# Patient Record
Sex: Male | Born: 1960 | Race: White | Hispanic: No | Marital: Married | Smoking: Never smoker
Health system: Southern US, Community
[De-identification: ages and names within clinical notes are randomized; demographics above are authoritative.]

## PROBLEM LIST (undated history)

## (undated) DIAGNOSIS — E119 Type 2 diabetes mellitus without complications: Secondary | ICD-10-CM

## (undated) DIAGNOSIS — I517 Cardiomegaly: Secondary | ICD-10-CM

## (undated) DIAGNOSIS — I452 Bifascicular block: Secondary | ICD-10-CM

## (undated) DIAGNOSIS — I2510 Atherosclerotic heart disease of native coronary artery without angina pectoris: Secondary | ICD-10-CM

## (undated) DIAGNOSIS — E785 Hyperlipidemia, unspecified: Secondary | ICD-10-CM

## (undated) DIAGNOSIS — I10 Essential (primary) hypertension: Secondary | ICD-10-CM

## (undated) DIAGNOSIS — I639 Cerebral infarction, unspecified: Secondary | ICD-10-CM

## (undated) DIAGNOSIS — Z951 Presence of aortocoronary bypass graft: Secondary | ICD-10-CM

## (undated) HISTORY — DX: Cerebral infarction, unspecified: I63.9

## (undated) HISTORY — DX: Hyperlipidemia, unspecified: E78.5

## (undated) HISTORY — DX: Type 2 diabetes mellitus without complications: E11.9

## (undated) HISTORY — DX: Cardiomegaly: I51.7

## (undated) HISTORY — DX: Essential (primary) hypertension: I10

## (undated) HISTORY — DX: Atherosclerotic heart disease of native coronary artery without angina pectoris: I25.10

## (undated) HISTORY — DX: Bifascicular block: I45.2

## (undated) HISTORY — DX: Presence of aortocoronary bypass graft: Z95.1

---

## 2007-05-27 ENCOUNTER — Encounter (INDEPENDENT_AMBULATORY_CARE_PROVIDER_SITE_OTHER): Payer: Self-pay | Admitting: Neurology

## 2007-05-27 ENCOUNTER — Inpatient Hospital Stay (HOSPITAL_COMMUNITY): Admission: EM | Admit: 2007-05-27 | Discharge: 2007-05-28 | Payer: Self-pay | Admitting: Emergency Medicine

## 2007-06-17 ENCOUNTER — Emergency Department (HOSPITAL_COMMUNITY): Admission: EM | Admit: 2007-06-17 | Discharge: 2007-06-17 | Payer: Self-pay | Admitting: *Deleted

## 2007-06-19 DIAGNOSIS — Z951 Presence of aortocoronary bypass graft: Secondary | ICD-10-CM

## 2007-06-19 HISTORY — DX: Presence of aortocoronary bypass graft: Z95.1

## 2007-06-23 ENCOUNTER — Ambulatory Visit: Payer: Self-pay | Admitting: Thoracic Surgery (Cardiothoracic Vascular Surgery)

## 2007-06-23 ENCOUNTER — Inpatient Hospital Stay (HOSPITAL_COMMUNITY): Admission: AD | Admit: 2007-06-23 | Discharge: 2007-06-29 | Payer: Self-pay | Admitting: Cardiovascular Disease

## 2007-06-23 HISTORY — PX: CARDIAC CATHETERIZATION: SHX172

## 2007-06-24 ENCOUNTER — Encounter: Payer: Self-pay | Admitting: Thoracic Surgery (Cardiothoracic Vascular Surgery)

## 2007-06-24 HISTORY — PX: CORONARY ARTERY BYPASS GRAFT: SHX141

## 2007-07-02 ENCOUNTER — Ambulatory Visit: Payer: Self-pay | Admitting: Thoracic Surgery (Cardiothoracic Vascular Surgery)

## 2007-07-19 ENCOUNTER — Ambulatory Visit: Payer: Self-pay | Admitting: Thoracic Surgery (Cardiothoracic Vascular Surgery)

## 2007-07-19 ENCOUNTER — Encounter
Admission: RE | Admit: 2007-07-19 | Discharge: 2007-07-19 | Payer: Self-pay | Admitting: Thoracic Surgery (Cardiothoracic Vascular Surgery)

## 2007-07-22 ENCOUNTER — Encounter (HOSPITAL_COMMUNITY): Admission: RE | Admit: 2007-07-22 | Discharge: 2007-08-18 | Payer: Self-pay | Admitting: Cardiovascular Disease

## 2007-08-19 ENCOUNTER — Encounter (HOSPITAL_COMMUNITY): Admission: RE | Admit: 2007-08-19 | Discharge: 2007-11-17 | Payer: Self-pay | Admitting: Cardiovascular Disease

## 2007-08-24 DIAGNOSIS — I517 Cardiomegaly: Secondary | ICD-10-CM

## 2007-08-24 HISTORY — DX: Cardiomegaly: I51.7

## 2007-09-08 ENCOUNTER — Encounter: Admission: RE | Admit: 2007-09-08 | Discharge: 2007-10-01 | Payer: Self-pay | Admitting: Neurology

## 2009-01-31 ENCOUNTER — Observation Stay (HOSPITAL_COMMUNITY): Admission: EM | Admit: 2009-01-31 | Discharge: 2009-02-01 | Payer: Self-pay | Admitting: Emergency Medicine

## 2009-05-29 ENCOUNTER — Emergency Department (HOSPITAL_COMMUNITY): Admission: EM | Admit: 2009-05-29 | Discharge: 2009-05-30 | Payer: Self-pay | Admitting: Emergency Medicine

## 2009-05-31 ENCOUNTER — Inpatient Hospital Stay (HOSPITAL_COMMUNITY): Admission: EM | Admit: 2009-05-31 | Discharge: 2009-06-03 | Payer: Self-pay | Admitting: Emergency Medicine

## 2009-05-31 ENCOUNTER — Encounter (INDEPENDENT_AMBULATORY_CARE_PROVIDER_SITE_OTHER): Payer: Self-pay | Admitting: General Surgery

## 2009-05-31 HISTORY — PX: CHOLECYSTECTOMY: SHX55

## 2009-08-08 IMAGING — CR DG CHEST 2V
2 series · 2 of 2 positions shown · non-contrast
Comparison: None.

Exam: Chest, 2 views.

HISTORY: Chest tightness.

[w chest pa]
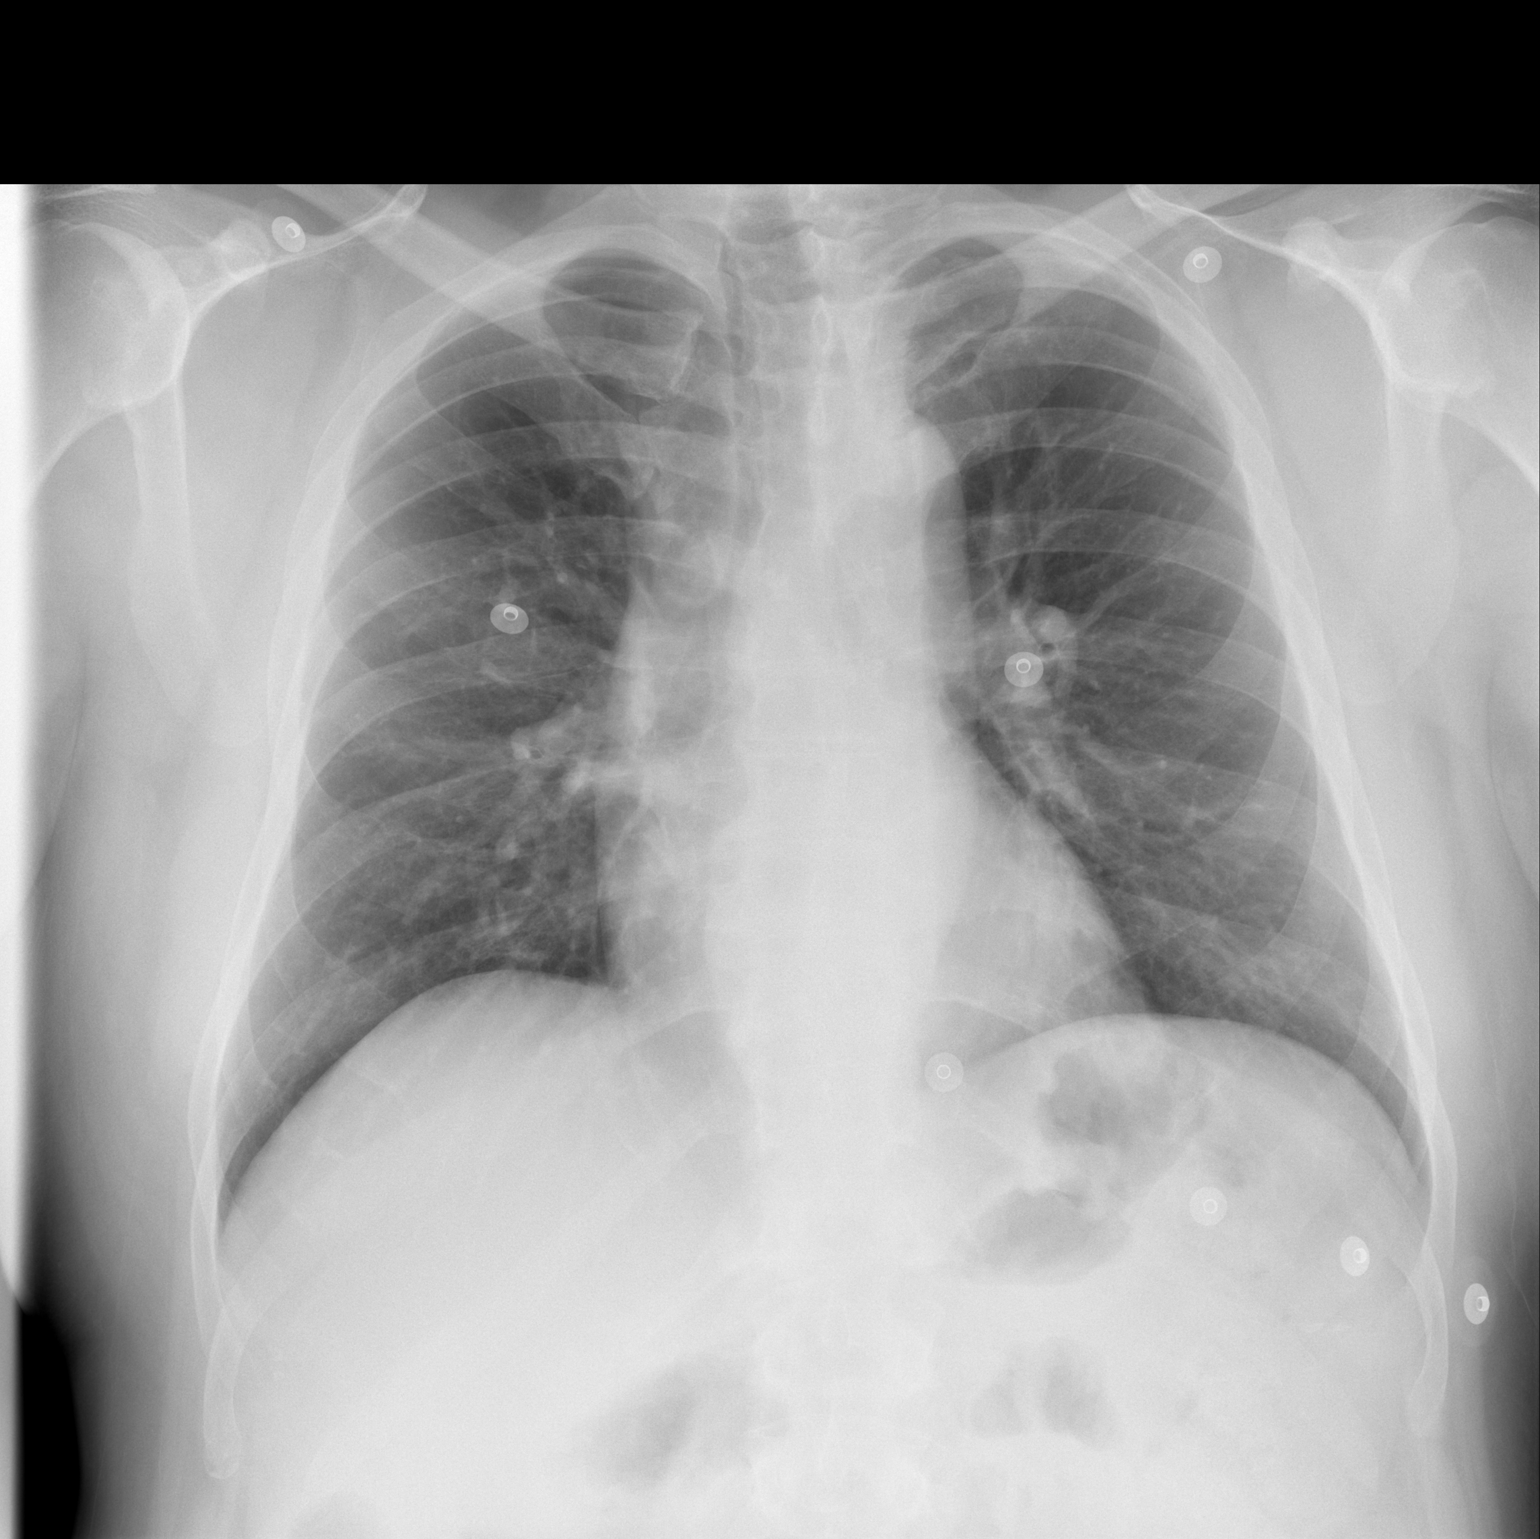

[w chest lat]
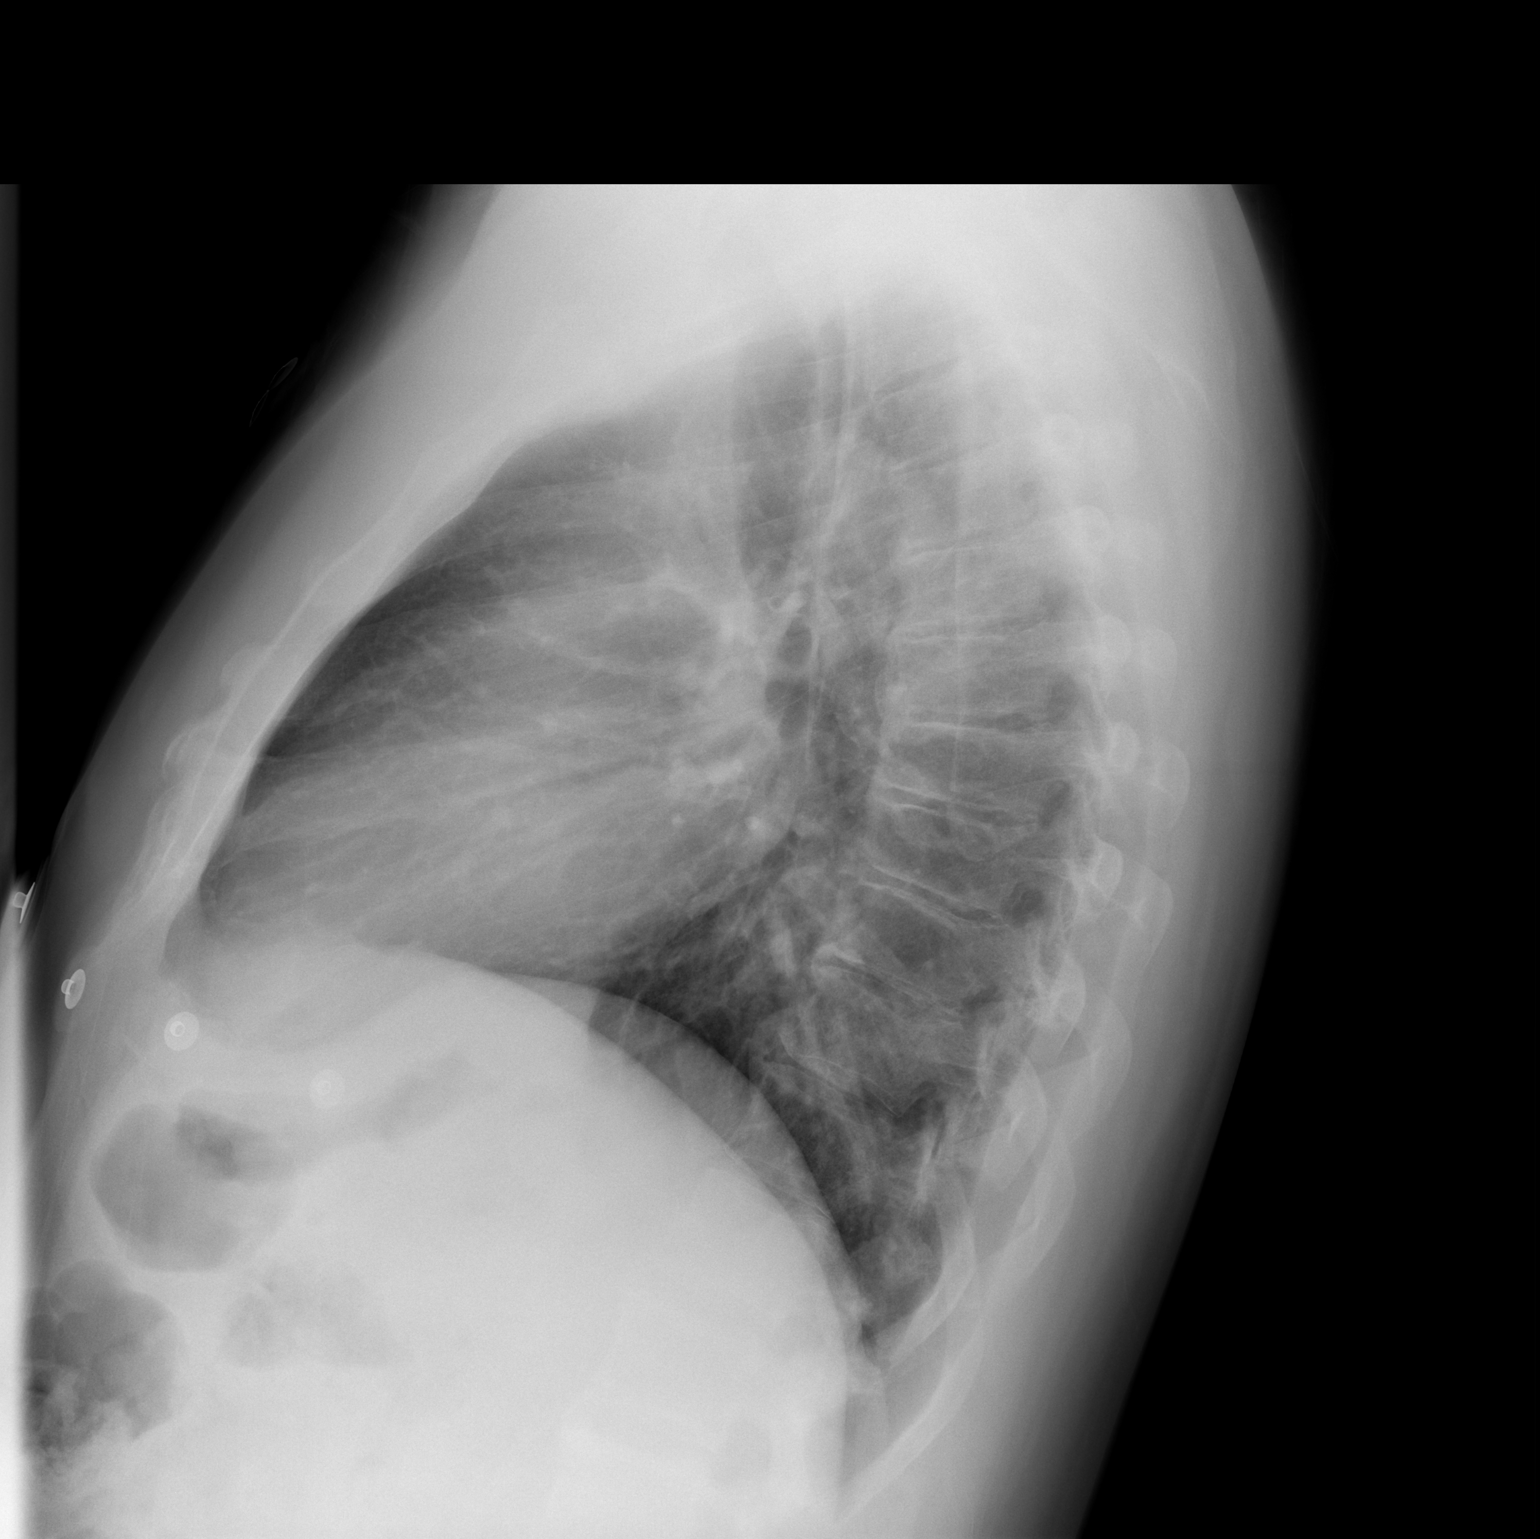

[2 of 2 positions shown; findings below may reference images not displayed]

FINDINGS: Heart size and mediastinal contours are normal.

There is no pleural fluid or pulmonary edema.

No airspace opacities are identified.
IMPRESSION: 1. No active cardiopulmonary disease.

## 2010-11-21 LAB — GLUCOSE, CAPILLARY
Glucose-Capillary: 106 mg/dL — ABNORMAL HIGH (ref 70–99)
Glucose-Capillary: 118 mg/dL — ABNORMAL HIGH (ref 70–99)
Glucose-Capillary: 154 mg/dL — ABNORMAL HIGH (ref 70–99)
Glucose-Capillary: 160 mg/dL — ABNORMAL HIGH (ref 70–99)
Glucose-Capillary: 178 mg/dL — ABNORMAL HIGH (ref 70–99)
Glucose-Capillary: 203 mg/dL — ABNORMAL HIGH (ref 70–99)

## 2010-11-21 LAB — DIFFERENTIAL
Basophils Absolute: 0 10*3/uL (ref 0.0–0.1)
Basophils Relative: 0 % (ref 0–1)
Eosinophils Absolute: 0 10*3/uL (ref 0.0–0.7)
Eosinophils Relative: 0 % (ref 0–5)
Lymphocytes Relative: 8 % — ABNORMAL LOW (ref 12–46)
Monocytes Relative: 3 % (ref 3–12)
Neutro Abs: 10.5 10*3/uL — ABNORMAL HIGH (ref 1.7–7.7)

## 2010-11-21 LAB — CBC
Hemoglobin: 11.4 g/dL — ABNORMAL LOW (ref 13.0–17.0)
Hemoglobin: 11.6 g/dL — ABNORMAL LOW (ref 13.0–17.0)
MCV: 99.8 fL (ref 78.0–100.0)
Platelets: 197 10*3/uL (ref 150–400)
Platelets: 221 10*3/uL (ref 150–400)
Platelets: 233 10*3/uL (ref 150–400)
RBC: 3.24 MIL/uL — ABNORMAL LOW (ref 4.22–5.81)
RBC: 3.37 MIL/uL — ABNORMAL LOW (ref 4.22–5.81)
RBC: 3.76 MIL/uL — ABNORMAL LOW (ref 4.22–5.81)
RDW: 12.6 % (ref 11.5–15.5)
RDW: 12.6 % (ref 11.5–15.5)
RDW: 12.5 % (ref 11.5–15.5)
WBC: 10.1 10*3/uL (ref 4.0–10.5)
WBC: 7.6 10*3/uL (ref 4.0–10.5)
WBC: 11.9 10*3/uL — ABNORMAL HIGH (ref 4.0–10.5)

## 2010-11-21 LAB — POCT CARDIAC MARKERS
CKMB, poc: 1.4 ng/mL (ref 1.0–8.0)
CKMB, poc: 2.1 ng/mL (ref 1.0–8.0)
Myoglobin, poc: 54.3 ng/mL (ref 12–200)
Troponin i, poc: <0.05 ng/mL (ref 0.00–0.09)
Troponin i, poc: <0.05 ng/mL (ref 0.00–0.09)

## 2010-11-21 LAB — COMPREHENSIVE METABOLIC PANEL
BUN: 15 mg/dL (ref 6–23)
Chloride: 98 mEq/L (ref 96–112)
Glucose, Bld: 246 mg/dL — ABNORMAL HIGH (ref 70–99)
Potassium: 4.1 mEq/L (ref 3.5–5.1)
Sodium: 133 mEq/L — ABNORMAL LOW (ref 135–145)
Total Protein: 7.5 g/dL (ref 6.0–8.3)

## 2010-11-25 LAB — LIPID PANEL
Cholesterol: 107 mg/dL (ref 0–200)
LDL Cholesterol: 45 mg/dL (ref 0–99)
Total CHOL/HDL Ratio: 4.5 RATIO
VLDL: 38 mg/dL (ref 0–40)

## 2010-11-25 LAB — CBC
Hemoglobin: 12.8 g/dL — ABNORMAL LOW (ref 13.0–17.0)
MCHC: 35.2 g/dL (ref 30.0–36.0)
RDW: 12.8 % (ref 11.5–15.5)

## 2010-11-25 LAB — CK TOTAL AND CKMB (NOT AT ARMC): Relative Index: 1.9 (ref 0.0–2.5)

## 2010-11-25 LAB — DIFFERENTIAL
Basophils Absolute: 0 10*3/uL (ref 0.0–0.1)
Basophils Relative: 0 % (ref 0–1)
Monocytes Absolute: 0.5 10*3/uL (ref 0.1–1.0)
Neutro Abs: 4.6 10*3/uL (ref 1.7–7.7)
Neutrophils Relative %: 73 % (ref 43–77)

## 2010-11-25 LAB — GLUCOSE, CAPILLARY
Glucose-Capillary: 115 mg/dL — ABNORMAL HIGH (ref 70–99)
Glucose-Capillary: 176 mg/dL — ABNORMAL HIGH (ref 70–99)

## 2010-11-25 LAB — POCT CARDIAC MARKERS
CKMB, poc: 1.3 ng/mL (ref 1.0–8.0)
Myoglobin, poc: 57.6 ng/mL (ref 12–200)
Troponin i, poc: <0.05 ng/mL (ref 0.00–0.09)
Troponin i, poc: <0.05 ng/mL (ref 0.00–0.09)

## 2010-11-25 LAB — APTT: aPTT: 28 seconds (ref 24–37)

## 2010-11-25 LAB — PROTIME-INR
INR: 1 (ref 0.00–1.49)
Prothrombin Time: 13.8 seconds (ref 11.6–15.2)

## 2010-11-25 LAB — BASIC METABOLIC PANEL
CO2: 25 mEq/L (ref 19–32)
Calcium: 8.9 mg/dL (ref 8.4–10.5)
Creatinine, Ser: 0.75 mg/dL (ref 0.4–1.5)
Glucose, Bld: 275 mg/dL — ABNORMAL HIGH (ref 70–99)

## 2010-11-25 LAB — COMPREHENSIVE METABOLIC PANEL
Albumin: 3.6 g/dL (ref 3.5–5.2)
BUN: 12 mg/dL (ref 6–23)
CO2: 27 mEq/L (ref 19–32)
Chloride: 103 mEq/L (ref 96–112)
Creatinine, Ser: 0.71 mg/dL (ref 0.4–1.5)
GFR calc non Af Amer: >60 mL/min (ref >60)
Glucose, Bld: 231 mg/dL — ABNORMAL HIGH (ref 70–99)
Total Bilirubin: 0.6 mg/dL (ref 0.3–1.2)

## 2010-11-25 LAB — HEMOGLOBIN A1C
Hgb A1c MFr Bld: 6.8 % — ABNORMAL HIGH (ref 4.6–6.1)
Mean Plasma Glucose: 148 mg/dL

## 2010-12-31 NOTE — Consult Note (Signed)
[name], [name]               ACCOUNT NO.:  [id number]   MEDICAL RECORD NO.:  [id number]          PATIENT TYPE:  INP   LOCATION:  2807                         FACILITY:  [facility]   PHYSICIAN:  Sheliah Plane, MD    DATE OF BIRTH:  20-Sep-1960   DATE OF CONSULTATION:  DATE OF DISCHARGE:                                 CONSULTATION   This is a cardiac surgery consultation urgently requested by Dr. Clarene Duke  in the catheterization lab.   FOLLOW-UP CARDIOLOGIST:  Nanetta Batty, M.D.   PRIMARY CARE PHYSICIAN:  Dr. Mallie Snooks.   REASON FOR CONSULTATION:  Critical coronary anatomy with ongoing chest  pain.   HISTORY OF PRESENT ILLNESS:  The patient is a 50 year old male known to  be diabetic, recently started on insulin 3 days ago, who had presented  to [facility] approximately 1 month ago with what was described as a  thalamic stroke.  The patient notes that he awoke during the night with  left arm and leg clumsiness and numbness and tingling, was hospitalized  on Dr. Marlis Edelson service.  Doppler studies showed no evidence of carotid  disease.  The patient was ultimately discharged home.  Since being home  he started having left arm numbness and left chest tightness, which he  attributed to the sequelae of his stroke.  Because of these symptoms, he  was seen by Dr. Allyson Sabal.  An EKG was obtained that compared to the EKG  that he had when he was admitted for the stroke, since that time he had  had a new right bundle branch block develop.  A Myoview stress test was  performed yesterday afternoon by Dr. Allyson Sabal, which showed severe anterior  apical ischemia.  Because of the marked abnormality on his stress test  and the discomfort in the left chest and arm, he was admitted last night  and underwent cardiac catheterization.  In the hospital he continues to  have episodic left chest and left arm discomfort.  Because of the  critical anatomy with probable clot in the proximal LAD, urgent  consult  was requested by Dr. Clarene Duke.   He has had no previous history of myocardial infarction or prior cardiac  surgery or prior angioplasty.  He does have a history of hypertension,  hyperlipidemia, type 2 diabetes for 7-8 years started on insulin 3 days  ago.  He is a nonsmoker but does chew tobacco.  Family history is positive.  Father had a myocardial infarction at age  8 and subsequently a stroke.  He has one brother age 11 without known  cardiac disease.  The patient denies claudication, denies renal  insufficiency.  No other medical problems.   PAST SURGICAL HISTORY:  Bilateral carpal tunnel.   SOCIAL HISTORY:  The patient is married, employed as a Chartered certified accountant with  some lifting.  Occasional alcohol use.   MEDICATIONS AT THE TIME OF ADMISSION:  Zocor and aspirin 325 mg a day.  The patient had not been on Plavix.   No known drug allergies.   REVIEW OF SYSTEMS:  CARDIAC:  Positive for chest pain.  He denies  palpitations, lower extremity edema, syncope, presyncope, orthopnea,  exertional or resting shortness of breath.  GENERAL:  Denies  constitutional symptoms such as fever and chills.  Denies shortness of  breath.  No hemoptysis.  No change in bowel habits.  NEUROLOGIC:  Symptoms as noted above.  Denies any joint discomfort.  Denies problems  with urination. No blood in his urine.  Denies easy bruisability.  Denies psychiatric history.   Blood pressure is 156/83, sinus rhythm at 77, O2 saturation is 97%.  Height is 5 feet 11 inches tall, 219 pounds.  The patient is awake, alert and neurologically intact grossly.  He has no carotid bruits.  LUNGS:  Clear bilaterally.  CARDIAC:  No murmur.  He does have multiple tattoos on both upper arms  and his chest.  He has no murmur of mitral insufficiency.  ABDOMEN:  Benign without palpable masses or tenderness.  Lower extremities have full DP and PT pulses bilaterally.   LABORATORY FINDINGS:  A CT scan dated October 9 that shows  acute infarct  in the right posterolateral thalamus.  No other infarcts were  identified.  There is no mass lesion and there is no intracranial  hemorrhage associated with it.  He had mild stenosis of the proximal  right posterior cerebral artery.  Carotid Doppler studies were done and  showed no carotid disease.   Laboratory findings reveal a __________  of 3.2, creatinine is 0.67.  Hematocrit is 38.3, white count is 9.3, platelet count is 273.  Hemoglobin A1c is 8.7.   Cardiac catheterization films are reviewed with Dr. Clarene Duke, which show a  high-grade complex proximal LAD diagonal lesion.  In addition, he has a  bifurcating intermediate vessel with disease at the bifurcation.  The  right coronary artery is a large vessel without significant disease.  Overall LV function appears preserved.  It appears that there is  probably clot in the proximal LAD.  Because of the complexity of the  lesion, the patient's ongoing intermittent pain, positive Myoview  Cardiolite stress with ischemia from the base to the apex, proceeding  with urgent coronary artery bypass grafting is recommended.  The  critical lesions do not appear very amenable to angioplasty, especially  in light of the patient's diabetes.  I discussed with the patient the  risks and options of surgery and also consulted with Dr. Caprice Kluver, and  we will make arrangements to proceed with bypass surgery later today.  The patient is agreeable and Dr. Cornelius Moras is available for surgery.      Sheliah Plane, MD  Electronically Signed     EG/MEDQ  D:  06/24/2007  T:  06/24/2007  Job:  [id number]   cc:   Mallie Snooks, MD  Thereasa Solo. Little, M.D.

## 2010-12-31 NOTE — Discharge Summary (Signed)
[name], [name]               ACCOUNT NO.:  [id number]   MEDICAL RECORD NO.:  [id number]          PATIENT TYPE:  INP   LOCATION:  2018                         FACILITY:  [facility]   PHYSICIAN:  Salvatore Decent. Cornelius Moras, M.D. DATE OF BIRTH:  [birth year]   DATE OF ADMISSION:  06/23/2007  DATE OF DISCHARGE:                               DISCHARGE SUMMARY   FINAL DIAGNOSIS:  Severe two-vessel coronary disease with class IV  unstable angina.   IN-HOSPITAL DIAGNOSES:  1. Postoperative volume overload.  2. Acute blood loss anemia postoperatively.   SECONDARY DIAGNOSES:  1. Diabetes mellitus, type 2.  2. Hypertension.  3. Hyperlipidemia.   IN-HOSPITAL OPERATIONS AND PROCEDURES:  1. Cardiac catheterization.  2. Urgent coronary bypass grafting x3 using a left internal mammary      artery to the distal left anterior descending coronary artery,      saphenous vein graft to first diagonal branch, saphenous vein graft      to lateral subbranch of the circumflex marginal branch, endoscopic      saphenous vein harvest from the left leg.   HISTORY AND PHYSICAL AND HOSPITAL COURSE:  The patient is a 50 year old  male with no previous history of coronary artery disease, who was  recently discovered to have type 2 diabetes mellitus.  The patient was  hospitalized approximately 1 month previously with what was felt to be a  stroke, likely related to hypertension.  Since then, the patient has  escalated in symptoms of numbness in his left arm associated with  tingling in his left chest.  The patient initially attributed his  symptoms to his previous stroke.  Ultimately, he was referred to Dr.  Allyson Sabal and EKG was notably different from that which he had done at that  time of his recent hospitalization.  A stress Myoview exam was done,  also demonstrating severe ischemia involving the anterior wall and apex  of the left ventricle.  He was admitted to the hospital and underwent  cardiac catheterization by  Dr. Clarene Duke.  At this, he was found to have  severe two-vessel coronary artery disease with critical anatomy  involving the proximal left anterior descending coronary artery.  The  patient was felt not to be a candidate for percutaneous coronary  intervention.  Following catheterization, Dr. Cornelius Moras was consulted.  Dr.  Cornelius Moras saw and evaluated the patient.  He discussed with the patient  undergoing urgent coronary artery bypass grafting.  He discussed risks  and benefits with the patient.  The patient acknowledged understanding  and agreed to proceed.  Surgery was scheduled for the same day of  catheterization, June 24, 2007.  For details of the patient's past  medical history and physical exam, please see dictated H&P.   The patient was taken to the operating room on June 24, 2007  urgently, where he underwent coronary artery bypass grafting x3 using a  left internal mammary artery to the distal left anterior descending  coronary artery, saphenous vein graft to first diagonal branch,  saphenous vein graft to a lateral subbranch and circumflex marginal  branch, endoscopic saphenous vein  harvest from left thigh.  The patient  tolerated this procedure well and was transferred to the intensive care  unit in stable condition.  Postoperatively, the patient was noted to be  hemodynamically stable.  He was extubated the evening of surgery.  Post  extubation, the patient was noted to be alert and oriented x4,  neurologically intact.  Postop day #1, the patient's vital signs were  monitored.  They were noted to be stable and he was weaned from all  drips.  Swan-Ganz catheter was discontinued in normal fashion.  Postoperative chest x-ray was clear and he had minimal drainage from  chest tubes.  Chest tubes were discontinued in normal fashion.  The  patient was noted to be in normal sinus rhythm.  The patient had slight  acute blood loss anemia with hemoglobin and hematocrit of 10 and 28%;  the  patient was asymptomatic.  The patient had mild volume overload and  was started on diuretics.  He is also continued on Lantus insulin for  his diabetes mellitus postoperatively.  The patient was felt to be  stable and transferred out to PCTU postop day #1.  While on telemetry  floor, the patient's vital signs were continued to be monitored.  They  did remain stable.  He was able to be weaned off oxygen, saturating  greater than 90% on room air.  The patient's weight was monitored daily.  His volume overload continued to improve and he remained on diuretics.  He was back near baseline prior to discharge home.  The patient's blood  sugars were followed closely postoperatively.  He was restarted on his  Actos and metformin by postop day #3.  He was also continued on low-dose  Lantus insulin at night.  Blood sugars currently remain elevated, but  will continue to not be monitored prior to discharge.  The patient  remained in normal sinus rhythm postoperatively.  His pulmonary status  remained stable.  All incisions are clean, dry and intact and healing  well.  His peripheral edema was improving.  He did have a hematoma at  his right knee incision site; this was monitored during his  hospitalization and remained stable.  The patient was out of bed and  ambulating well without difficulty.  He was tolerating diet well with no  nausea or vomiting noted.   The patient is tentatively ready for discharge home over the next 48  hours pending he remains stable.   FOLLOWUP APPOINTMENTS:  A followup appointment will be arranged with Dr.  Cornelius Moras for 3 weeks; our office will contact the patient at this  information.  The patient will need to obtain PA and lateral chest x-ray  30 minutes prior to this appointment.  The patient will need followup  with Dr. Allyson Sabal in 2 weeks; he will need to contact Dr. Hazle Coca office to  make these arrangements.   ACTIVITY:  The patient is instructed in no driving until  released to do  so, no lifting over 10 pounds.  He is told to ambulate 3-4 times per day  and progress as tolerated and continue his breathing exercises.   INCISIONAL CARE:  The patient is told to shower, washing his incisions  using soap and water.  He is to contact the office if he develops any  drainage or opening from any of his incision sites.   DIET:  The patient was educated on diet to be low-fat, low-salt as well  as carbohydrate-modified medium-calorie diet.  DISCHARGE MEDICATIONS:  1. Aspirin 325 mg daily.  2. Lopressor 50 mg b.i.d..  3. Simvastatin 20 mg at night.  4. Diovan 160 mg daily.  5. Metformin 500 mg b.i.d..  6. Actos 15 mg daily.  7. Lasix 40 mg daily for 5 days.  8. Potassium chloride 20 mEq daily for 5 days.  9. Oxycodone 5 mg one to two tablets q.4-6 h. p.r.n. pain.      Theda Belfast, PA      Salvatore Decent. Cornelius Moras, M.D.  Electronically Signed    KMD/MEDQ  D:  06/27/2007  T:  06/28/2007  Job:  [id number]   cc:   Nanetta Batty, M.D.

## 2010-12-31 NOTE — Op Note (Signed)
[name], [name]               ACCOUNT NO.:  [id number]   MEDICAL RECORD NO.:  [id number]          PATIENT TYPE:  INP   LOCATION:  2316                         FACILITY:  [facility]   PHYSICIAN:  Salvatore Decent. Cornelius Moras, M.D. DATE OF BIRTH:  [birth year]   DATE OF PROCEDURE:  06/24/2007  DATE OF DISCHARGE:                               OPERATIVE REPORT   PREOPERATIVE DIAGNOSIS:  Severe two-vessel coronary artery disease with  class 4 unstable angina   POSTOPERATIVE DIAGNOSIS:  Severe two-vessel coronary artery disease with  class 4 unstable angina   PROCEDURE:  Urgent median sternotomy for coronary artery bypass grafting  x3 (left internal mammary artery to distal left anterior descending  coronary artery, saphenous vein graft to first diagonal branch,  saphenous vein graft to the lateral subbranch of the circumflex marginal  branch, endoscopic saphenous vein harvest from left thigh).   SURGEON:  Dr. Purcell Nails.   ASSISTANT:  Ms. Zadie Rhine.   ANESTHESIA:  General.   CLINICAL NOTE:  The patient is a 50 year old male with no previous  history of coronary artery disease who was recently discovered to have  type 2 diabetes mellitus.  The patient was hospitalized approximately 1  month previously with what was felt to be a stroke likely related to  hypertension.  Since then the patient has had escalating symptoms of  numbness in his left arm associated with tightness in his left chest.  The patient initially attributed the symptoms to his previous stroke.  Ultimately he was referred to Dr. Nanetta Batty and electrocardiogram  was notably different from that which he had done at the time of his  recent hospitalization.  A stress Myoview exam was done also  demonstrating severe ischemia involving the anterior wall and the apex  of the left ventricle.  He was admitted to the hospital that day and  underwent cardiac catheterization earlier this morning by Dr. Caprice Kluver.  The  patient was found to have severe two-vessel coronary artery disease  with critical anatomy involving the proximal left anterior descending  coronary artery felt not to be a candidate for percutaneous coronary  intervention.  Urgent surgical consultation was requested and the  patient was initially seen and evaluated by Dr. Sheliah Plane.  A full  consultation note has been dictated previously.  Urgent surgical  revascularization has been recommended.   OPERATIVE CONSENT:  The patient has been counseled regarding the  indications, risks, and potential benefits of surgery.  Alternative  treatment strategies have been discussed.  The patient understands and  accepts all potential associated risks of surgery and desires to proceed  as described.   OPERATIVE FINDINGS:  1. Normal left ventricular systolic function with mild-to-moderate      left ventricular hypertrophy.  2. Good-quality left internal mammary artery and saphenous vein      conduit for grafting  3. Diffuse coronary artery disease with fair-quality target vessels      for grafting.   DESCRIPTION OF PROCEDURE:  The patient is brought directly from the cath  lab to the operating room on the afternoon of  June 24, 2007 and  placed in the supine position on the operating table.  General  endotracheal anesthesia is introduced uneventfully under the care and  direction of Dr. Judie Petit.  Specifically, a Swan-Ganz catheter  is placed through the right internal jugular approach.  A radial  arterial line is placed.  Intravenous antibiotics were administered.  Following induction with general endotracheal anesthesia, a Foley  catheter is placed.  The patient's chest, abdomen, both groins, and both  lower extremities were prepared and draped in sterile manner.  Baseline  transesophageal echocardiogram is performed by Dr. Randa Evens.  This  demonstrates normal left ventricular systolic function with mild to  moderate left  ventricular hypertrophy.   A median sternotomy incision is performed and the left internal mammary  artery is dissected from the chest wall and prepared for bypass  grafting.  The left internal mammary artery is a good-quality conduit.  Simultaneously the saphenous vein is obtained from the patient's left  thigh using endoscopic vein harvest technique.  Initially, a small  incision is made in the right thigh but the saphenous vein on that side  is felt to be small.  The saphenous vein is then removed from the left  thigh using endoscopic vein harvest technique through a small incision  just above the left knee.  The saphenous vein is slightly small-caliber  but otherwise a good-quality conduit.  After the saphenous vein is  removed, the small incisions in both thighs are closed in multiple  layers with running absorbable suture.  The patient is heparinized  systemically and the left internal mammary artery transected distally.  The left internal mammary artery has excellent flow.   The pericardium is opened.  The ascending aorta is normal in appearance.  The ascending aorta and the right atrium are cannulated for  cardiopulmonary bypass.  Adequate heparinization is verified.  A  retrograde cardioplegic catheter was placed through the right atrium  into the coronary sinus.  Cardiopulmonary bypass was begun and the  surface of the heart inspected.  Distal target vessels are selected for  coronary bypass grafting.  A temperature probe is placed in the left  ventricular septum and a cardioplegic catheter was placed in the  ascending aorta.   The patient is allowed to cool passively to 32 degrees systemic  temperature.  The aortic crossclamp is applied and cold blood  cardioplegia is administered initially in an antegrade fashion through  the aortic root.  Iced saline slush is applied for topical hypothermia.  The initial cardioplegic arrest and myocardial cooling is felt to be  excellent.   Supplemental cardioplegia is administered retrograde through  the coronary sinus catheter.  Repeat doses of cardioplegia are  administered intermittently throughout the crossclamp portion of the  operation through the aortic root, down the subsequently placed vein  grafts, and retrograde through the coronary sinus catheter to maintain  left ventricular septal temperature below 15 degrees centigrade.   The following distal coronary anastomoses are performed:  1. The lateral subbranch of the circumflex marginal branch is grafted      with a saphenous vein graft in an end-to-side fashion. This vessel      measures 2.0 mm in diameter.  It is intramyocardial but a good-      quality target vessel at the site of distal grafting.  2. The first diagonal branch off the left anterior descending coronary      artery is grafted with a saphenous vein graft in an end-to-side  fashion.  This vessel measures 1.0 mm in diameter and is a fair-      quality target vessel for grafting.  3. The distal left anterior descending coronary artery is grafted with      the left internal mammary artery in an end-to-side fashion.  This      vessel is diffusely diseased.  At the site of distal grafting it      measures 1.5 mm in diameter, although this is far out towards the      apex.  A 1.5-mm probe will pass in both directions easily.   Both proximal saphenous vein anastomoses are performed directly to the  ascending aorta prior to removal of the aortic crossclamp.  The left  ventricular septal temperature rises rapidly with reperfusion of the  left internal mammary artery.  One final dose of warm retrograde hot  shot cardioplegia is administered.  The aortic crossclamp is removed  after a total crossclamp time of 63 minutes.  The heart began to beat  spontaneously and normal sinus rhythm resumes.  All proximal and distal  coronary anastomoses are inspected for hemostasis and appropriate graft  orientation.  The retrograde cardioplegic catheter was removed.  Epicardial pacing wires fixed to the right ventricular free wall into  the right atrial appendage.  The patient is rewarmed to 37 degrees  centigrade temperature.  The patient is weaned from cardiopulmonary  bypass without difficulty.  The patient's rhythm at separation from  bypass is normal sinus rhythm.  No inotropic support is required.  Total  cardiopulmonary bypass time for the operation is 76 minutes.  Follow-up  transesophageal echocardiogram performed following separation from  bypass demonstrates normal left ventricular function.   The venous and arterial cannulae are removed uneventfully.  Protamine is  administered to reverse the anticoagulation.  The mediastinum and the  left chest are irrigated with saline solution containing vancomycin.  Meticulous surgical hemostasis is ascertained.  The mediastinum and the  left chest are drained using three chest tubes exited through separate  stab incisions inferiorly.  The pericardium and soft tissues anterior to  the aorta are reapproximated loosely.  The sternum was closed with  double-strength sternal wire.  The soft tissues anterior to the sternum  are closed in multiple layers and the skin is closed with running  subcuticular skin closure.   The patient tolerated the procedure well and was transported to the  surgical intensive care unit in stable condition.  There are no  intraoperative complications.  All sponge, instrument and needle counts  were verified correct at the completion of the operation.  No blood  products were administered.      Salvatore Decent. Cornelius Moras, M.D.  Electronically Signed     CHO/MEDQ  D:  06/24/2007  T:  06/25/2007  Job:  [id number]   cc:   Nanetta Batty, M.D.  Thereasa Solo. Little, M.D.  Mallie Snooks, MD

## 2010-12-31 NOTE — Cardiovascular Report (Signed)
NAMEJERE, BOSTROM               ACCOUNT NO.:  [id number]   MEDICAL RECORD NO.:  [id number]          PATIENT TYPE:  INP   LOCATION:  2399                         FACILITY:  [facility]   PHYSICIAN:  Thereasa Solo. Little, M.D. DATE OF BIRTH:  [birth year]   DATE OF PROCEDURE:  06/23/2007  DATE OF DISCHARGE:                            CARDIAC CATHETERIZATION   INDICATIONS FOR TEST:  This 50 year old hypertensive diabetic male has  not had treatment for two problems for approximately 4 years.  He  presented on May 27, 2007 with a left body stroke that appeared to be  thalamic small vessel disease that involved his thalamus.  He was seen  on June 17, 2007 in the emergency room, and at that time an EKG  showed a new right bundle left anterior fascicular block.  The patient  has complained of chest and arm tightness that he related to residual  from the stroke.  Because of the abnormality, Dr. Aida Puffer sent him  to see Dr. Allyson Sabal who performed a Cardiolite study that was strongly  positive, and the patient was admitted on May 23, 2007 for IV heparin  and urgent cath which was performed this morning.   I spoke to the patient prior to the cath.  He was still having the  residual numbness from his stroke and the residual tightness that he  felt was related to the stroke.   After obtaining informed consent, the patient was prepped and draped in  the usual sterile fashion, exposing the right groin.  Under local  anesthetic with 1% Xylocaine, the Seldinger technique was employed, and  a 5-French introducer sheath was placed in the right femoral artery.  Left and right coronary arteriography, ventriculography in the RAO  projection was performed.   COMPLICATIONS:  None.   EQUIPMENT:  5-French Judkins configuration catheters.   TOTAL CONTRAST USED:  115 mL.   RESULTS:   HEMODYNAMIC MONITORING:  Central aortic pressure was 138/86.  Left  ventricular pressure was 147/19.  At the time of  pullback across the  aortic valve, there was no aortic valve gradient present.   VENTRICULOGRAPHY:  Ventriculography in the RAO projection done at the  end of the procedure revealed normal LV systolic function with an  ejection fraction in excess of 55%.  The apex appeared to be mildly  hypokinetic, but there was no evidence of a filling defect in the left  ventricle to suggest an apical thrombus.  The left ventricular end-  diastolic pressure was 24.  No mitral regurgitation was appreciated.   CORONARY ARTERIOGRAPHY:  1. Left main.  It bifurcated.  2. Circumflex.  The circumflex was a small nondominant vessel that      stayed in the AV groove.  3. LAD.  The LAD was a large vessel that extended down to the apex of      the heart. It only had TIMI I flow.  The vessel appeared to be      subtotaled just after the takeoff of the first diagonal.  There was      thrombus present just after the  first diagonal, and the area of      high-grade stenosis appeared to be about 20 mm in length.  There      was late slow filling of the distal vessel.  The distal vessel was      clearly underfilled and appeared to be relatively small in      diameter, but I suspect that is more related to being underfilled.      Intracoronary nitroglycerin did not change the TIMI flow, and there      is a questionable lesion in the midportion of the vessel of around      40-50%.  4. Optional diagonal.  This was a large vessel that bifurcated.  At      the bifurcation in the upper limb of the bifurcation was a focal      area of 70-80% narrowing.  The lower limb was free of disease.      Attempts at stenting the area of focal narrowing would result in      the stent extending well into the main body of the optional      diagonal.  5. Right coronary artery.  The right coronary was a very large      dominant vessel.  It gave rise to a PDA and 4 posterolateral      vessels, all of which were free of disease.  There  were collaterals      from the right coronary artery filling the distal portion of the      LAD.   Because of the high-grade stenosis in the optional diagonal system and  the subtotaled anatomy in the LAD, particularly in view of it extending  all the way up to the large first diagonal vessel, decision was made  after talking with Dr. Jenne Campus to refer the patient for urgent bypass  surgery.           ______________________________  Thereasa Solo Little, M.D.     ABL/MEDQ  D:  06/24/2007  T:  06/24/2007  Job:  [id number]   cc:   Nanetta Batty, M.D.  Aida Puffer  [facility]

## 2010-12-31 NOTE — Assessment & Plan Note (Signed)
OFFICE VISIT   KIKO, RIPP R  DOB:  [birth year]                                        July 19, 2007  CHART #:  [id number]   HISTORY OF PRESENT ILLNESS:  Mr. Proch returns for routine followup  status post coronary artery bypass grafting x3 on June 24, 2007.  His  postoperative recovery has been uncomplicated.  Following hospital  discharge, he has continued to gradually improve.  He was evaluated in  the office by Dr. Gery Pray one week ago, and he has also been seen in  followup by his primary care physician, Dr. Clarene Duke.  He has had some  mild swelling in his legs that has improved.  He also had some  hypoglycemia.  He was instructed to stop taking Actos, and his dose of  hydrochlorothiazide has been cut back to half dose.  He has also been  started on Lexapro for some situational depression.  Overall, Mr. Westervelt  reports that he is otherwise doing fine. He has minimal residual  soreness in his chest.  He has no exertional shortness of breath and in  fact he states that his exercise tolerance is much better than it was  prior to surgery.  He does still complain of numbness involving his left  forearm and hand related to his previous stroke.  This has been slow to  improve, if it has improved at all.  He is proud to report that his  blood sugars have been under fairly good control.  He has no other  complaints.   The remainder of his medications remain unchanged from the time of  hospital discharge.   PHYSICAL EXAMINATION:  Notable for a well-appearing male with a blood  pressure of 124/70, pulse 70, oxygen saturation 98% on room air.  Examination of the chest reveals a median sternotomy incision that is  healing nicely.  The sternum is stable on palpation.  Breath sounds are  clear to auscultation and symmetrical bilaterally.  No wheezes or  rhonchi are noted.  Cardiovascular exam includes regular rate and  rhythm.  No murmurs, rubs, or gallops are  appreciated.  The abdomen is  soft and nontender.  Bowel sounds are present.  The extremities are warm  and well perfused.  The right lower extremity endoscopic and harvest  incisions have healed nicely.  There is trivial lower extremity edema.   Diagnostic tests:  Chest x-ray obtained today is reviewed.  This  demonstrates clear lung fields bilaterally with very small left pleural  effusion and associated mild left lower lobe atelectasis.   IMPRESSION:  Satisfactory progress following recent coronary artery  bypass grafting.   PLAN:  I have encouraged Mr. Mcgregor to continue to gradually increase his  physical activity as tolerated.  His only limitation, at this point,  remaining that he refrain from heavy lifting or strenuous use of his  arms or shoulders for at least another 2 months.  With respect to his  persistent numbness and weakness involving his left forearm and hand  related to his recent stroke, he may need some physical therapy and/or  occupational therapy.  He may ultimately qualify for long-term  disability depending upon return of function.  We will this to Dr.  Marlis Edelson discretion under the circumstances with his recent stroke.  We  have discussed his long-term  prognosis with respect to his coronary  artery disease, as well as the intendant need for him to continue to  maintain very strict control of his diabetes indefinitely in the future.  All of his questions have been addressed.  In the future, Mr. Tedesco will  call and return to see [place] as needed.   Salvatore Decent. Cornelius Moras, M.D.  Electronically Signed   CHO/MEDQ  D:  07/19/2007  T:  07/20/2007  Job:  [id number]   cc:   Nanetta Batty, M.D.  Pramod P. Pearlean Brownie, MD

## 2010-12-31 NOTE — H&P (Signed)
[name], [name]               ACCOUNT NO.:  [id number]   MEDICAL RECORD NO.:  [id number]          PATIENT TYPE:  EMS   LOCATION:  MAJO                         FACILITY:  [facility]   PHYSICIAN:  Genene Churn. Love, M.D.    DATE OF BIRTH:  Mar 26, 1961   DATE OF ADMISSION:  05/27/2007  DATE OF DISCHARGE:                              HISTORY & PHYSICAL   This is the first [name] admission for this 50 year old  right-handed white married male from [place], [place], seen in  the emergency room and admitted for evaluation of left-sided weakness  and numbness.   HISTORY OF PRESENT ILLNESS:  [name] is formerly from [place].  He  moved to [place] approximately 5 years ago.  While in [place]  approximately 10-15 years ago, he was told that he had hypertension and  about 8 years ago was told that he had diabetes mellitus.  Neither one  of these diseases are currently being treated with any medications.  He  has done seen a physician since living in [place].  He went to bed on  the night of admission feeling well about midnight and awoke about 2  a.m. with cramping on his left side, noticed numbness on his left side  and fell when he tried to get out of bed.  He had no associated  headache, syncope, chest pain, palpitations or seizure with this event.  Symptomatology improved en route to the emergency room.  He states that  2 days prior to admission he had some difficulty with chest pain.   He has had a known past history of hypertension for 15 years not on  treatment, diabetes mellitus for 8 years, not on treatment, has had  carpal tunnel syndrome to the right and left hand in approximately 1989.   He is currently on no medications and has no known history of drug  allergies except to ONE ANTIBIOTIC, and he cannot remember that name.   He does not smoke cigarettes but he does chew tobacco.  He drinks  approximately six beers per week.   SOCIAL HISTORY:  He  finished high school.  Works as Psychologist, counselling.  He has no children.  He is married.   FAMILY HISTORY:  His mother is in her 76s.  Father is 66, living and  well.  He has a brother 28, living and well.   MEDICAL REVIEW OF SYSTEMS:  He does snore at night.  There is no  definite history of sleep apnea.  He does have diabetes and hypertension  as mentioned, which are currently not being treated.   Examination revealed a well-developed, obese white male.  Blood pressure in the right arm 170/85, left arm 160/85 (when he arrived  in the emergency room, his systolics were greater than 220 and he  received IV labetalol).  Heart rate was 83 and regular.  There were no  bruits.  Mental status:  He was alert and oriented x3.  Followed one, two and  three-step commands.  Cranial nerve examination with visual fields full.  He had  blurred margins to the nasal portion of the disk bilaterally.  The extraocular movements were full.  Corneals were present.  He had a  seventh nerve palsy.  Tongue was midline.  Uvula was midline.  Gags were  present.  Sternocleidomastoid and trapezius testing was normal.  Motor  examination revealed 5/5 strength in the upper and lower extremities.  He had mild left arm drift.  Sensory examination was intact to pinprick,  touch, joint position and vibration testing.  Deep tendon reflexes were  1-2+ and plantar responses were downgoing.  General examination revealed the tympanic membranes to be clear.  Mouth  was in good repair.  There was no heart murmur.  Lungs were clear.  There was no enlargement of the liver, spleen or kidneys.  His bowel  sounds were normal.  His genitalia were normal.   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  I  did not see any definite evidence of stroke.  It was reported that he  had had a right basal ganglia stroke.  I noticed that there was some  obscuration of the sulci over the  convexities, raising the question of  cerebral edema.  There was not, however, any changes in the ventricular  size and the sulci below the parietal region were well-seen.  EKG showed  left axis deviation.  PTT was 29, INR 1.1.  Drug screen negative.  Sodium 138, potassium 3.3, chloride 102, CO2 content 29, BUN 8,  creatinine 0.65, glucose 279.  White blood cell count was 11,000,  hemoglobin 14.5, hematocrit 41.6, platelets 273,000.  Urine drug screen  was negative.   IMPRESSION:  1. Right brain transient ischemic attack versus stroke, code 435.9 and      434.1, with NIH Stroke Scale of 1.  2. Hypertension, code 796.2.  3. Diabetes mellitus, code 250.60.   Plan at this time is to admit the patient for further evaluation.  He  will be placed on aspirin therapy and Lovenox.  I will start  antihypertensives.           ______________________________  Genene Churn. Sandria Manly, M.D.     JML/MEDQ  D:  05/27/2007  T:  05/27/2007  Job:  [id number]

## 2010-12-31 NOTE — Discharge Summary (Signed)
[name], [name]               ACCOUNT NO.:  [id number]   MEDICAL RECORD NO.:  [id number]          PATIENT TYPE:  INP   LOCATION:  3022                         FACILITY:  [facility]   PHYSICIAN:  Pramod P. Pearlean Brownie, MD    DATE OF BIRTH:  12/25/1960   DATE OF ADMISSION:  05/27/2007  DATE OF DISCHARGE:  05/28/2007                               DISCHARGE SUMMARY   DISCHARGE DIAGNOSES:  1. 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  2. Hypertension.  3. Diabetes.  4. Obesity.  5. Dyslipidemia.   DISCHARGE MEDICATIONS:  1. Diovan 80 mg a day.  2. Aspirin 325 mg a day.  3. Zocor 20 mg a day.  4. Metformin 500 mg b.i.d.   STUDIES PERFORMED:  1. CT of the brain on admission showed no acute abnormalities.      Subacute to chronic right basal ganglia lacune.  2. MRI of the brain showed  an acute right thalamic infarct.  An MRA      of the brain showed mild stenosis of the proximal right PCA.  3. 2-D echocardiogram showed EF of 50% to 60% with no left ventricular      regional wall motion abnormalities.  No embolic source.  4. Carotid Doppler showed no ICA stenosis.  5. Transcranial Doppler was performed, results pending.  6. EKG showed normal sinus rhythm.   LABORATORY STUDIES:  CBC with white blood cells 11, otherwise normal.  Chemistry with potassium 3.3, glucose 279, otherwise normal.  Coagulation studies on admission normal.  Liver function tests normal.  Cardiac enzymes negative.  Cholesterol 157, triglycerides 123, HDL 25  and LDL 107.  Urinalysis 0-2 white blood cells, 0-2 red blood cells,  otherwise negative.  Homocystine is pending.  Alcohol level less than 5.  Hemoglobin A1c 9.  Urine drug screen negative.   HISTORY OF PRESENT ILLNESS:  Mr. [name] is a 50 year old right-  handed Caucasian male who is formally from [place].  He moved to  [place] approximately 5 years ago.  While in [place], he was told he  had hypertension about 10-15 years ago,  and then about 8 years ago was  told that he had diabetes.  He was treated with medications there, but  has not been currently treated with any medications, as he has not seen  a physician since moving to [place].  He went to bed the night of  admission feeling well about midnight, and woke at 2 a.m. with cramping  on the left side.  At that time he noticed numbness or his left side and  fell when he tried to get out of the bed.  He had no associated  headache, syncope, chest pain, palpitations or seizure with this event.  Symptomatology improved en route to the emergency room.  Of note, he  states 2 days prior to admission he had some difficulty with chest pain.  He was admitted to the hospital for further stroke evaluation.  He was  not a TPA candidate secondary to quick resolution of symptoms.   Carpal tunnel syndrome right and left hand  in 1989.   He chews tobacco.   HOSPITAL COURSE:  MRI was positive for acute right thalamic MRI for  stroke that is associated with his left hand and foot numbness that  remains.  His stroke was felt to be secondary to small-vessel disease.  He was placed on aspirin for secondary stroke prevention.  The patient  needs tight risk factor control and was placed on Diovan for  hypertension, Zocor for dyslipidemia, metformin for his diabetes, and  asked to lose weight and exercise.  The patient does not have a primary  care physician in town, and it was asked that he obtain one and see them  within 1-2 weeks.  The patient was able to ambulate without difficulty  with only some mild clumsiness due to his numbness, and was felt safe to  return home.  He will return home with his wife and pursue primary care  physician, and follow up with Dr. Pearlean Brownie.   CONDITION ON DISCHARGE:  The patient is alert and oriented x3.  Speech  clear.  No aphasia.  He has no extremity drift and his strength is  normal all extremities.  His heart rate is regular.  His breath  sounds  are clear.  He had some sensation of decreased numbness in his left hand  and left foot.  His gait is steady and unremarkable.  His visual fields  are full and his extraocular movements are intact.   DISCHARGE PLAN:  1. Discharge home with wife.  2. Aspirin for secondary stroke prevention.  3. New antihypertensive statin and diabetes medicine at time of      discharge.  Need followup by primary care physician within 1 month.   FOLLOWUP:  Dr. Delia Heady in 2-3 months.      Annie Main, N.P.    ______________________________  Sunny Schlein. Pearlean Brownie, MD    SB/MEDQ  D:  05/28/2007  T:  05/29/2007  Job:  [id number]   cc:   Pramod P. Pearlean Brownie, MD

## 2010-12-31 NOTE — Discharge Summary (Signed)
NAMEDHYAN, NOAH               ACCOUNT NO.:  [id number]   MEDICAL RECORD NO.:  [id number]          PATIENT TYPE:  INP   LOCATION:  2002                         FACILITY:  [facility]   PHYSICIAN:  Salvatore Decent. Cornelius Moras, M.D. DATE OF BIRTH:  1961-05-20   DATE OF ADMISSION:  06/23/2007  DATE OF DISCHARGE:  06/29/2007                               DISCHARGE SUMMARY   ADDENDUM  At the time of the previous dictation, Mr. Knick was tentatively  scheduled for discharge home on June 29, 2007.  Over the course of  the 48 hours following the previously dictated discharge summary, he  progressed as expected.  His blood pressure and heart rate began to  trend upward and his home dose of amlodipine was restarted.  He  continued to diurese well and at the time of discharge was 1 kg above  his preoperative weight. His home diabetes medications were restarted  and his blood sugars stabilized.  Otherwise he remained stable. New labs  show sodium of 140, potassium 4.0, BUN 9, creatinine 0.63.  He was  evaluated on the morning of June 29, 2007 and was deemed ready for  discharge home at that time.   Discharge followup, diet and activity are unchanged from the previously  dictated discharge summary.   Discharge medications are unchanged from the previously dictated  discharge summary with the exception of Lopressor which was increased to  100 mg b.i.d.      Coral Ceo, P.A.      Salvatore Decent. Cornelius Moras, M.D.  Electronically Signed    GC/MEDQ  D:  08/05/2007  T:  08/06/2007  Job:  [id number]   cc:   Nanetta Batty, M.D.

## 2010-12-31 NOTE — H&P (Signed)
NAMEIGNAZIO, KINCAID               ACCOUNT NO.:  [id number]   MEDICAL RECORD NO.:  [id number]          PATIENT TYPE:  EMS   LOCATION:  MAJO                         FACILITY:  [facility]   PHYSICIAN:  Nanetta Batty, M.D.   DATE OF BIRTH:  11/09/1960   DATE OF ADMISSION:  01/31/2009  DATE OF DISCHARGE:                              HISTORY & PHYSICAL   CHIEF COMPLAINT:  Chest pain.   HISTORY OF PRESENT ILLNESS:  Mr. Brunty is a 50 year old male who  initially presented May 28, 2007 with left arm pain and numbness and  an MRI was remarkable for a right thalamic stroke.  He was discharged  and then readmitted June 17, 2007 with left arm pain and a new right  bundle branch block.  He had a Myoview test which was markedly positive  for anterior ischemia.  He underwent catheterization and subsequent  bypass surgery x3 with a LIMA to the LAD, SVG to the diagonal and SVG to  the circumflex.  He had normal LV function.  Postoperatively, he has  done well.  He works as a Chartered certified accountant.  He does quite a bit of heavy  lifting.  About a week or so ago, he had noted some vague midsternal  chest pain which sort of radiates to his left shoulder.  He does have  some residual chronic left arm pain and numbness from his stroke.  Symptoms are becoming progressive over the last week he became concerned  and went to his family doctor, Dr. Aida Puffer.  Dr. Clarene Duke sent him to  the ER at [facility] for further evaluation.  Currently he is symptom-free.  He denies any shortness of breath.   PAST MEDICAL HISTORY:  Remarkable for type 2 diabetes.  He has a past  history of hypertension.  He has treated dyslipidemia.  He has had no  major surgeries.   HOME MEDICATIONS:  1. Metformin 500 mg twice a day  2. Metoprolol 100 mg a day.  3. Zocor 20 mg a day.  4. HCTZ 25 mg a day.  5. Diovan 160 mg a day.  6. Aspirin 325 mg a day.  7. Lantus 25 units a day.   ALLERGIES:  He has no known drug allergies.   SOCIAL  HISTORY:  He is married.  He is a nonsmoker.  He has no children.   FAMILY HISTORY:  Unremarkable for coronary artery disease.   REVIEW OF SYSTEMS:  Remarkable for remote carpal tunnel syndrome.   PHYSICAL EXAM:  VITAL SIGNS:  Blood pressure in the emergency room was  102/70, pulse 78, respirations 12.  GENERAL:  He is a well-developed, overweight male who appears somewhat  fearful and anxious, but in no acute distress.  HEENT: Normocephalic, atraumatic.  Extraocular muscles intact.  Sclera  is not icteric.  Lids and conjunctivae are within normal limits.  NECK:  Without JVD.  He does have a right carotid bruit on exam,  previously he has had unremarkable carotid Dopplers.  CHEST:  Clear to auscultation and percussion.  CARDIAC:  Reveals regular rate and rhythm without obvious murmur, rub  or  gallop.  Normal S1, S2.  ABDOMEN:  Nontender.  No hepatosplenomegaly.  EXTREMITIES:  Without edema.  Distal pulses are 3+/4.  There are no  femoral bruits.  NEURO:  Grossly intact.  He does have some residual left arm weakness  from his stroke and also some chronic left arm numbness and pain.  SKIN:  Cool and dry.  He has multiple tattoos on his upper extremities.   EKG shows sinus rhythm with a right bundle branch block.   LABS:  Sodium 136, potassium 3.9, BUN 12, creatinine 0.75.  White count  6.3, hemoglobin 12.8, hematocrit 36.4, platelets 190.  Troponin is  negative.  Chest x-ray shows no active disease.   IMPRESSION:  1. Left shoulder and left chest pain, rule out cardiac.  2. Coronary artery bypass grafting x3, October 2008.  At that time he      had somewhat of an atypical presentation with left arm pain.  3. Right bundle branch block.  4. Treated hypertension.  5. Treated dyslipidemia.  6. Type 2 insulin-dependent diabetes.  7. Obesity.  8. Right thalamic cerebrovascular accident, May 28, 2007, with      residual left arm pain and numbness.   DISPOSITION:  The patient is  admitted for further evaluation.  We will  go ahead and cycle his enzymes and keep him n.p.o. after midnight.  If  his enzymes are positive, he will need diagnostic catheterization.  If  not, we may consider outpatient stress.  We will also add anti-  inflammatory and PPI as well as nitrates and Lovenox.      Abelino Derrick, P.A.      Nanetta Batty, M.D.  Electronically Signed    LKK/MEDQ  D:  01/31/2009  T:  01/31/2009  Job:  [id number]

## 2011-01-03 NOTE — Discharge Summary (Signed)
NAMEKHADIR, [name]               ACCOUNT NO.:  [id number]   MEDICAL RECORD NO.:  [id number]          PATIENT TYPE:  OBV   LOCATION:  4734                         FACILITY:  [facility]   PHYSICIAN:  Nanetta Batty, M.D.   DATE OF BIRTH:  [birth year]   DATE OF ADMISSION:  01/31/2009  DATE OF DISCHARGE:  02/01/2009                               DISCHARGE SUMMARY   DISCHARGE DIAGNOSES:  1. Chest pain, rule out cardiac.  2. Coronary artery bypass grafting x3 in October 2008.  3. Good left ventricular function.  4. Type 2 insulin-dependent diabetes.  5. Treated hypertension.  6. Treated dyslipidemia.   HOSPITAL COURSE:  The patient is a 50 year old male, who initially  presented to [place] in October 2008 with left arm pain.  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  He was discharged and then readmitted  later in October with left arm pain and a new right bundle-branch block.  A Myoview test was markedly positive, and then he underwent  catheterization and subsequent bypass surgery x3 with LIMA to the LAD  and SVG to the diagonal, SVG to the circumflex.  He had done fairly well  postoperatively.  He works as a Chartered certified accountant.  He presented to Dr. Fredirick Maudlin  office with midsternal chest pain, which radiated to his left shoulder.  He was admitted for unstable angina.  He was started on Lovenox and  enzymes were obtained.  These were negative for an MI.  Dr. Mariah Milling felt  like he could be discharged on February 01, 2009, and set up for an  outpatient Myoview, which will be done on the 18th.   DISCHARGE MEDICATIONS:  1. Metformin 500 mg b.i.d.  2. His metoprolol has been cut back to 100 mg in the morning, 50 mg in      the evening.  3. Simvastatin 20 mg a day.  4. Hydrochlorothiazide 25 mg one-half tablet a day.  5. Niaspan has been increased to 500 mg 2 tablets daily.  6. Diovan 160 mg a day.  7. Aspirin 325 mg nightly.  8. Lantus p.r.n.  9. Nitroglycerin 0.4 mg a day.  10.Prilosec 20 mg a  day.  11.Advil 800 mg 3 times a day for 5 days.   LABORATORY DATA:  White count 6.3, hemoglobin 12.8, hematocrit 36.4,  platelet count 190.  CK-MB and troponin are negative.  Sodium 136,  potassium 3.9, BUN 12, creatinine 0.75.   Chest x-ray showed no acute findings.  EKG shows sinus rhythm with a right bundle-branch block.   DISPOSITION:  The patient is discharged in stable condition and will  have an outpatient stress test tomorrow.      Abelino Derrick, P.A.      Nanetta Batty, M.D.  Electronically Signed    LKK/MEDQ  D:  03/13/2009  T:  03/14/2009  Job:  [id number]

## 2011-05-27 LAB — URINE CULTURE
Colony Count: NO GROWTH
Culture: NO GROWTH

## 2011-05-27 LAB — CROSSMATCH: Antibody Screen: NEGATIVE

## 2011-05-27 LAB — URINALYSIS, ROUTINE W REFLEX MICROSCOPIC
Bilirubin Urine: NEGATIVE
Glucose, UA: 250 — AB
Ketones, ur: NEGATIVE
Leukocytes, UA: NEGATIVE
Nitrite: NEGATIVE
Protein, ur: 30 — AB
Specific Gravity, Urine: 1.013
Urobilinogen, UA: 0.2
Urobilinogen, UA: 4 — ABNORMAL HIGH
pH: 6

## 2011-05-27 LAB — BASIC METABOLIC PANEL
BUN: 11
BUN: 13
BUN: 6
BUN: 9
CO2: 29
Calcium: 7.2 — ABNORMAL LOW
Calcium: 8.1 — ABNORMAL LOW
Chloride: 106
Chloride: 107
Chloride: 98
Creatinine, Ser: 0.55
Creatinine, Ser: 0.63
Creatinine, Ser: 0.67
GFR calc Af Amer: >60
GFR calc Af Amer: >60
GFR calc non Af Amer: >60
GFR calc non Af Amer: >60
Glucose, Bld: 102 — ABNORMAL HIGH
Glucose, Bld: 104 — ABNORMAL HIGH
Glucose, Bld: 139 — ABNORMAL HIGH
Potassium: 3.2 — ABNORMAL LOW
Potassium: 3.4 — ABNORMAL LOW
Potassium: 3.6
Sodium: 140

## 2011-05-27 LAB — DIFFERENTIAL
Eosinophils Relative: 2
Lymphocytes Relative: 30
Lymphs Abs: 2.8
Monocytes Absolute: 0.8 — ABNORMAL HIGH
Monocytes Relative: 8

## 2011-05-27 LAB — CK TOTAL AND CKMB (NOT AT ARMC)
CK, MB: 2.1
Relative Index: 1.3
Total CK: 159

## 2011-05-27 LAB — POCT I-STAT 3, ART BLOOD GAS (G3+)
Acid-Base Excess: 1
Acid-base deficit: 1
Bicarbonate: 22.8
O2 Saturation: 100
O2 Saturation: 100
O2 Saturation: 99
O2 Saturation: 99
Operator id: 274841
Patient temperature: 36.7
Patient temperature: 37.6
TCO2: 24
TCO2: 28
pCO2 arterial: 30.3 — ABNORMAL LOW
pCO2 arterial: 34.1 — ABNORMAL LOW
pH, Arterial: 7.535 — ABNORMAL HIGH
pO2, Arterial: 124 — ABNORMAL HIGH
pO2, Arterial: 155 — ABNORMAL HIGH
pO2, Arterial: 236 — ABNORMAL HIGH

## 2011-05-27 LAB — POCT I-STAT 4, (NA,K, GLUC, HGB,HCT)
Glucose, Bld: 108 — ABNORMAL HIGH
Glucose, Bld: 124 — ABNORMAL HIGH
Glucose, Bld: 128 — ABNORMAL HIGH
HCT: 26 — ABNORMAL LOW
HCT: 30 — ABNORMAL LOW
Hemoglobin: 7.8 — CL
Hemoglobin: 8.8 — ABNORMAL LOW
Hemoglobin: 9.9 — ABNORMAL LOW
Operator id: 3291
Operator id: 3291
Operator id: 3291
Operator id: 3291
Potassium: 3.3 — ABNORMAL LOW
Potassium: 3.6
Potassium: 3.7
Potassium: 4.1
Sodium: 136
Sodium: 139
Sodium: 139
Sodium: 140
Sodium: 142

## 2011-05-27 LAB — CBC
HCT: 28 — ABNORMAL LOW
HCT: 28.1 — ABNORMAL LOW
HCT: 29.5 — ABNORMAL LOW
HCT: 37.3 — ABNORMAL LOW
HCT: 38.3 — ABNORMAL LOW
Hemoglobin: 10 — ABNORMAL LOW
Hemoglobin: 10.3 — ABNORMAL LOW
Hemoglobin: 13.3
Hemoglobin: 13.6
MCHC: 34.9
MCHC: 35
MCHC: 35.5
MCV: 92.3
MCV: 92.6
Platelets: 176
Platelets: 186
Platelets: 274
RBC: 3.18 — ABNORMAL LOW
RBC: 4.21 — ABNORMAL LOW
RDW: 11.9
RDW: 11.9
RDW: 12.1
RDW: 12.3
RDW: 12.3
WBC: 8.9
WBC: 9.3

## 2011-05-27 LAB — COMPREHENSIVE METABOLIC PANEL
ALT: 22
ALT: 28
AST: 18
Alkaline Phosphatase: 74
BUN: 12
CO2: 29
CO2: 29
Calcium: 8.8
Chloride: 102
Creatinine, Ser: 0.55
GFR calc Af Amer: >60
GFR calc non Af Amer: >60
GFR calc non Af Amer: >60
Glucose, Bld: 184 — ABNORMAL HIGH
Potassium: 3.1 — ABNORMAL LOW
Sodium: 134 — ABNORMAL LOW
Total Bilirubin: 0.9

## 2011-05-27 LAB — URINE MICROSCOPIC-ADD ON

## 2011-05-27 LAB — ABO/RH: ABO/RH(D): A NEG

## 2011-05-27 LAB — BLOOD GAS, ARTERIAL
Drawn by: 122401
FIO2: 0.21
pCO2 arterial: 39.6
pO2, Arterial: 74.9 — ABNORMAL LOW

## 2011-05-27 LAB — PROTIME-INR
INR: 1
INR: 1
INR: 1.3
Prothrombin Time: 13.2

## 2011-05-27 LAB — POCT I-STAT 3, VENOUS BLOOD GAS (G3P V)
Bicarbonate: 24.2 — ABNORMAL HIGH
TCO2: 25
pH, Ven: 7.45 — ABNORMAL HIGH
pO2, Ven: 37

## 2011-05-27 LAB — TROPONIN I: Troponin I: 0.02

## 2011-05-27 LAB — HEMOGLOBIN AND HEMATOCRIT, BLOOD: Hemoglobin: 8.9 — ABNORMAL LOW

## 2011-05-28 LAB — BASIC METABOLIC PANEL
BUN: 10
CO2: 29
Calcium: 8.7
Creatinine, Ser: 0.71
Glucose, Bld: 150 — ABNORMAL HIGH

## 2011-05-28 LAB — DIFFERENTIAL
Basophils Absolute: 0
Basophils Relative: 0
Monocytes Absolute: 0.8 — ABNORMAL HIGH
Neutro Abs: 6.7
Neutrophils Relative %: 66

## 2011-05-28 LAB — CBC
MCHC: 35.1
RDW: 12

## 2011-05-28 LAB — POCT CARDIAC MARKERS
Myoglobin, poc: 81.4
Operator id: 275321

## 2011-05-29 LAB — HEMOGLOBIN A1C
Hgb A1c MFr Bld: 8.7 — ABNORMAL HIGH
Hgb A1c MFr Bld: 9 — ABNORMAL HIGH

## 2011-05-29 LAB — LIPID PANEL
HDL: 25 — ABNORMAL LOW
Total CHOL/HDL Ratio: 6.3
Triglycerides: 123
VLDL: 25

## 2011-05-29 LAB — COMPREHENSIVE METABOLIC PANEL WITH GFR
ALT: 25
AST: 22
Albumin: 3.6
Alkaline Phosphatase: 90
BUN: 8
CO2: 29
Calcium: 8 — ABNORMAL LOW
Chloride: 102
Creatinine, Ser: 0.65
GFR calc non Af Amer: >60
Glucose, Bld: 279 — ABNORMAL HIGH
Potassium: 3.3 — ABNORMAL LOW
Sodium: 138
Total Bilirubin: 0.6
Total Protein: 7

## 2011-05-29 LAB — RAPID URINE DRUG SCREEN, HOSP PERFORMED: Cocaine: NOT DETECTED

## 2011-05-29 LAB — URINALYSIS, ROUTINE W REFLEX MICROSCOPIC
Glucose, UA: >1000 — AB
Hgb urine dipstick: NEGATIVE
Leukocytes, UA: NEGATIVE
Specific Gravity, Urine: 1.03
Urobilinogen, UA: 1

## 2011-05-29 LAB — CARDIAC PANEL(CRET KIN+CKTOT+MB+TROPI)
CK, MB: 2.5
Relative Index: 1.4
Troponin I: 0.02

## 2011-05-29 LAB — APTT
aPTT: 29
aPTT: 30

## 2011-05-29 LAB — BLOOD GAS, ARTERIAL
Acid-Base Excess: 0.7
Bicarbonate: 24.5 — ABNORMAL HIGH
Drawn by: 276051
O2 Saturation: 97.1
TCO2: 25.6
pCO2 arterial: 37.1
pO2, Arterial: 81.8

## 2011-05-29 LAB — CBC
HCT: 41.6
Hemoglobin: 14.5
MCHC: 34.8
MCV: 92.5
Platelets: 273
RBC: 4.5
RDW: 12.5
WBC: 11 — ABNORMAL HIGH

## 2011-05-29 LAB — DIFFERENTIAL
Basophils Absolute: 0
Basophils Relative: 0
Eosinophils Absolute: 0.2
Eosinophils Relative: 2
Lymphocytes Relative: 14
Lymphs Abs: 1.6
Monocytes Absolute: 0.7
Monocytes Relative: 7
Neutro Abs: 8.5 — ABNORMAL HIGH
Neutrophils Relative %: 77

## 2011-05-29 LAB — HOMOCYSTEINE: Homocysteine: 6.4

## 2011-05-29 LAB — URINE MICROSCOPIC-ADD ON

## 2012-10-17 ENCOUNTER — Encounter: Payer: Self-pay | Admitting: *Deleted

## 2012-11-11 ENCOUNTER — Encounter: Payer: Self-pay | Admitting: Cardiovascular Disease

## 2013-12-23 ENCOUNTER — Other Ambulatory Visit: Payer: Self-pay | Admitting: Family Medicine

## 2013-12-23 ENCOUNTER — Ambulatory Visit
Admission: RE | Admit: 2013-12-23 | Discharge: 2013-12-23 | Disposition: A | Discharge status: Home / Self Care | Payer: BC Managed Care – PPO | Source: Ambulatory Visit | Attending: Family Medicine | Admitting: Family Medicine

## 2013-12-23 DIAGNOSIS — I259 Chronic ischemic heart disease, unspecified: Secondary | ICD-10-CM

## 2014-02-10 ENCOUNTER — Ambulatory Visit (INDEPENDENT_AMBULATORY_CARE_PROVIDER_SITE_OTHER): Payer: BC Managed Care – PPO | Admitting: Cardiovascular Disease

## 2014-02-10 ENCOUNTER — Encounter: Payer: Self-pay | Admitting: Cardiovascular Disease

## 2014-02-10 VITALS — BP 112/70 | HR 61 | Ht 70.0 in | Wt 220.0 lb

## 2014-02-10 DIAGNOSIS — I2510 Atherosclerotic heart disease of native coronary artery without angina pectoris: Secondary | ICD-10-CM

## 2014-02-10 DIAGNOSIS — I10 Essential (primary) hypertension: Secondary | ICD-10-CM

## 2014-02-10 DIAGNOSIS — E785 Hyperlipidemia, unspecified: Secondary | ICD-10-CM

## 2014-02-10 DIAGNOSIS — E119 Type 2 diabetes mellitus without complications: Secondary | ICD-10-CM | POA: Insufficient documentation

## 2014-02-10 DIAGNOSIS — I2583 Coronary atherosclerosis due to lipid rich plaque: Principal | ICD-10-CM

## 2014-02-10 NOTE — Assessment & Plan Note (Signed)
Controlled on current medications 

## 2014-02-10 NOTE — Assessment & Plan Note (Signed)
Status post coronary bypass grafting by Dr. Cornelius Moraswen  November 2008 with a LIMA to his LAD, vein to diagonal branch and marginal branch. He had a negative Myoview stress test 03/04/12. He denies chest pain or shortness of breath.

## 2014-02-10 NOTE — Progress Notes (Signed)
02/10/2014 [name]   05-Jan-1961  [id number]  Primary Physician Aida PufferLITTLE,JAMES, MD Primary Cardiologist: Runell GessJonathan J. Berry MD Roger RenoFACP,FACC,FAHA, FSCAI   HPI:  The patient is a 53 year old, mildly overweight, married Caucasian male with no children who I last saw a year ago. He has a history of CAD status post bypass grafting x3 by Dr. Ashley Marinerub Owen November 2008 with a LIMA to his LAD, a vein to a diagonal branch and marginal branch. He did well postoperatively and completed cardiac rehab. His other problems include hypertension, hyperlipidemia and noninsulin-requiring diabetes. He has had a stroke in the past followed by Dr. Delia HeadyPramod Sethi. He had a Myoview performed 03/04/12 which was nonischemic. He denies chest pain or shortness of breath.     Current Outpatient Prescriptions  Medication Sig Dispense Refill  . aspirin 325 MG EC tablet Take 325 mg by mouth daily.      Marland Kitchen. atorvastatin (LIPITOR) 40 MG tablet Take 40 mg by mouth daily.      . insulin glargine (LANTUS) 100 UNIT/ML injection Inject 26 Units into the skin daily.      . metFORMIN (GLUCOPHAGE) 500 MG tablet Take 500 mg by mouth 2 (two) times daily with a meal.      . metoprolol (LOPRESSOR) 100 MG tablet Take 100 mg by mouth 2 (two) times daily.      . potassium chloride SA (K-DUR,KLOR-CON) 20 MEQ tablet Take 20 mEq by mouth daily.      . valsartan (DIOVAN) 160 MG tablet Take 160 mg by mouth daily.      . Vitamin D, Ergocalciferol, (DRISDOL) 50000 UNITS CAPS Take 50,000 Units by mouth every 7 (seven) days.       No current facility-administered medications for this visit.    No Known Allergies  History   Social History  . Marital Status: Married    Spouse Name: N/A    Number of Children: N/A  . Years of Education: N/A   Occupational History  . Not on file.   Social History Main Topics  . Smoking status: Never Smoker   . Smokeless tobacco: Current User    Types: Chew  . Alcohol Use: 0.5 oz/week    1 drink(s) per  week     Comment: occasional drinker  . Drug Use: Not on file  . Sexual Activity: Not on file   Other Topics Concern  . Not on file   Social History Narrative  . No narrative on file     Review of Systems: General: negative for chills, fever, night sweats or weight changes.  Cardiovascular: negative for chest pain, dyspnea on exertion, edema, orthopnea, palpitations, paroxysmal nocturnal dyspnea or shortness of breath Dermatological: negative for rash Respiratory: negative for cough or wheezing Urologic: negative for hematuria Abdominal: negative for nausea, vomiting, diarrhea, bright red blood per rectum, melena, or hematemesis Neurologic: negative for visual changes, syncope, or dizziness All other systems reviewed and are otherwise negative except as noted above.    Blood pressure 112/70, pulse 61, height 5\' 10"  (1.778 m), weight 220 lb (99.791 kg).  General appearance: alert and no distress Neck: no adenopathy, no carotid bruit, no JVD, supple, symmetrical, trachea midline and thyroid not enlarged, symmetric, no tenderness/mass/nodules Lungs: clear to auscultation bilaterally Heart: regular rate and rhythm, S1, S2 normal, no murmur, click, rub or gallop Extremities: extremities normal, atraumatic, no cyanosis or edema  EKG sinus rhythm at 61 with right bundle branch block unchanged from prior EKGs  ASSESSMENT  AND PLAN:   Hyperlipidemia On statin therapy followed by his PCP  Essential hypertension Controlled on current medications  Coronary artery disease Status post coronary bypass grafting by Dr. Cornelius Moraswen  November 2008 with a LIMA to his LAD, vein to diagonal branch and marginal branch. He had a negative Myoview stress test 03/04/12. He denies chest pain or shortness of breath.      Runell GessJonathan J. Berry MD FACP,FACC,FAHA, [facility] 02/10/2014 12:56 PM

## 2014-02-10 NOTE — Assessment & Plan Note (Signed)
On statin therapy followed by his PCP 

## 2014-02-10 NOTE — Patient Instructions (Signed)
Your physician wants you to follow-up in: 1 year with Dr Berry. You will receive a reminder letter in the mail two months in advance. If you don't receive a letter, please call our office to schedule the follow-up appointment.  

## 2014-11-30 ENCOUNTER — Telehealth: Payer: Self-pay | Admitting: Cardiovascular Disease

## 2014-12-11 NOTE — Telephone Encounter (Signed)
Closed encounter °

## 2015-02-16 ENCOUNTER — Encounter: Payer: Self-pay | Admitting: Cardiovascular Disease

## 2015-02-16 ENCOUNTER — Ambulatory Visit (INDEPENDENT_AMBULATORY_CARE_PROVIDER_SITE_OTHER): Payer: BC Managed Care – PPO | Admitting: Cardiovascular Disease

## 2015-02-16 VITALS — BP 110/70 | HR 68 | Ht 70.0 in | Wt 225.8 lb

## 2015-02-16 DIAGNOSIS — I2510 Atherosclerotic heart disease of native coronary artery without angina pectoris: Secondary | ICD-10-CM | POA: Diagnosis not present

## 2015-02-16 DIAGNOSIS — I10 Essential (primary) hypertension: Secondary | ICD-10-CM

## 2015-02-16 DIAGNOSIS — E785 Hyperlipidemia, unspecified: Secondary | ICD-10-CM | POA: Diagnosis not present

## 2015-02-16 DIAGNOSIS — I2583 Coronary atherosclerosis due to lipid rich plaque: Secondary | ICD-10-CM

## 2015-02-16 NOTE — Assessment & Plan Note (Signed)
History of coronary artery disease status post bypass grafting by Dr. Ashley Marinerub Owen  November 2008 with a LIMA to his LAD, vein to diagonal branch and marginal branch. His last Myoview performed 03/04/12 was nonischemic. He denies chest pain or shortness of breath.

## 2015-02-16 NOTE — Assessment & Plan Note (Signed)
History of hyperlipidemia on atorvastatin 40 mg a day followed by his PCP 

## 2015-02-16 NOTE — Assessment & Plan Note (Signed)
History of hypertension with blood pressure measured 110/70. He is on metoprolol and valsartan. Continue current meds at current dosing

## 2015-02-16 NOTE — Progress Notes (Signed)
02/16/2015 Roger DyerMichael R Thayne   October 06, 1960  [id number]  Primary Physician Lolita PatellaEADE,ROBERT ALEXANDER, MD Primary Cardiologist: Runell GessJonathan J. Berry MD Roseanne RenoFACP,FACC,FAHA, FSCAI   HPI:  The patient is a 54 year old, mildly overweight, married Caucasian male with no children who I last saw a year ago. He has a history of CAD status post bypass grafting x3 by Dr. Ashley Marinerub Owen November 2008 with a LIMA to his LAD, a vein to a diagonal branch and marginal branch. He did well postoperatively and completed cardiac rehab. His other problems include hypertension, hyperlipidemia and noninsulin-requiring diabetes. He has had a stroke in the past followed by Dr. Delia HeadyPramod Sethi. He had a Myoview performed 03/04/12 which was nonischemic. He denies chest pain or shortness of breath.   Current Outpatient Prescriptions  Medication Sig Dispense Refill  . aspirin 325 MG EC tablet Take 325 mg by mouth daily.    Marland Kitchen. atorvastatin (LIPITOR) 40 MG tablet Take 40 mg by mouth daily.    . insulin glargine (LANTUS) 100 UNIT/ML injection Inject 26 Units into the skin daily.    . metFORMIN (GLUCOPHAGE) 500 MG tablet Take 500 mg by mouth 2 (two) times daily with a meal.    . metoprolol (LOPRESSOR) 100 MG tablet Take 100 mg by mouth 2 (two) times daily.    . valsartan (DIOVAN) 160 MG tablet Take 160 mg by mouth daily.     No current facility-administered medications for this visit.    No Known Allergies  History   Social History  . Marital Status: Married    Spouse Name: N/A  . Number of Children: N/A  . Years of Education: N/A   Occupational History  . Not on file.   Social History Main Topics  . Smoking status: Never Smoker   . Smokeless tobacco: Current User    Types: Chew  . Alcohol Use: 0.5 oz/week    1 drink(s) per week     Comment: occasional drinker  . Drug Use: Not on file  . Sexual Activity: Not on file   Other Topics Concern  . Not on file   Social History Narrative     Review of Systems: General:  negative for chills, fever, night sweats or weight changes.  Cardiovascular: negative for chest pain, dyspnea on exertion, edema, orthopnea, palpitations, paroxysmal nocturnal dyspnea or shortness of breath Dermatological: negative for rash Respiratory: negative for cough or wheezing Urologic: negative for hematuria Abdominal: negative for nausea, vomiting, diarrhea, bright red blood per rectum, melena, or hematemesis Neurologic: negative for visual changes, syncope, or dizziness All other systems reviewed and are otherwise negative except as noted above.    Blood pressure 110/70, pulse 68, height 5\' 10"  (1.778 m), weight 225 lb 12.8 oz (102.422 kg).  General appearance: alert and no distress Neck: no adenopathy, no carotid bruit, no JVD, supple, symmetrical, trachea midline and thyroid not enlarged, symmetric, no tenderness/mass/nodules Lungs: clear to auscultation bilaterally Heart: regular rate and rhythm, S1, S2 normal, no murmur, click, rub or gallop Extremities: extremities normal, atraumatic, no cyanosis or edema  EKG normal sinus rhythm at 68 with right bundle-branch block and left anterior fascicular block unchanged from prior EKGs. I personally reviewed this EKG  ASSESSMENT AND PLAN:   Hyperlipidemia History of hyperlipidemia on atorvastatin 40 mg a day followed by his PCP  Essential hypertension History of hypertension with blood pressure measured 110/70. He is on metoprolol and valsartan. Continue current meds at current dosing  Coronary artery disease History of coronary artery  disease status post bypass grafting by Dr. Ashley Mariner  November 2008 with a LIMA to his LAD, vein to diagonal branch and marginal branch. His last Myoview performed 03/04/12 was nonischemic. He denies chest pain or shortness of breath.      Runell Gess MD FACP,FACC,FAHA, [facility] 02/16/2015 10:01 AM

## 2015-02-16 NOTE — Patient Instructions (Signed)
Dr Berry recommends that you schedule a follow-up appointment in 1 year. You will receive a reminder letter in the mail two months in advance. If you don't receive a letter, please call our office to schedule the follow-up appointment. 

## 2016-02-04 ENCOUNTER — Encounter: Payer: Self-pay | Admitting: Cardiovascular Disease

## 2016-03-07 ENCOUNTER — Ambulatory Visit: Payer: BC Managed Care – PPO | Admitting: Cardiovascular Disease

## 2016-04-04 ENCOUNTER — Ambulatory Visit (INDEPENDENT_AMBULATORY_CARE_PROVIDER_SITE_OTHER): Payer: BC Managed Care – PPO | Admitting: Cardiovascular Disease

## 2016-04-04 ENCOUNTER — Encounter (INDEPENDENT_AMBULATORY_CARE_PROVIDER_SITE_OTHER): Payer: Self-pay

## 2016-04-04 ENCOUNTER — Encounter: Payer: Self-pay | Admitting: Cardiovascular Disease

## 2016-04-04 VITALS — BP 108/66 | HR 60 | Ht 70.0 in | Wt 224.0 lb

## 2016-04-04 DIAGNOSIS — I2583 Coronary atherosclerosis due to lipid rich plaque: Secondary | ICD-10-CM

## 2016-04-04 DIAGNOSIS — I10 Essential (primary) hypertension: Secondary | ICD-10-CM | POA: Diagnosis not present

## 2016-04-04 DIAGNOSIS — E785 Hyperlipidemia, unspecified: Secondary | ICD-10-CM

## 2016-04-04 DIAGNOSIS — I2510 Atherosclerotic heart disease of native coronary artery without angina pectoris: Secondary | ICD-10-CM

## 2016-04-04 NOTE — Patient Instructions (Signed)
Medication Instructions:  Your physician recommends that you continue on your current medications as directed. Please refer to the Current Medication list given to you today.   Labwork: Labwork will be requested from your primary care physician.  Follow-Up: Your physician wants you to follow-up in: 12 MONTHS WITH DR BERRY. You will receive a reminder letter in the mail two months in advance. If you don't receive a letter, please call our office to schedule the follow-up appointment.   If you need a refill on your cardiac medications before your next appointment, please call your pharmacy.   

## 2016-04-04 NOTE — Assessment & Plan Note (Signed)
History of CAD status post coronary artery bypass grafting by Dr. Cornelius Moraswen  November 2008. He had a LIMA to his LAD, vein to vital branch and marginal branch. He did well postoperatively. His last Myoview performed 03/04/12 was nonischemic. He denies chest pain or shortness of breath.

## 2016-04-04 NOTE — Progress Notes (Signed)
04/04/2016 [name]   29-Dec-1960  [id number]  Primary Physician Lolita PatellaEADE,ROBERT ALEXANDER, MD Primary Cardiologist: Runell GessJonathan J Landon Bassford MD Nicholes CalamityFACP, FACC, FAHA, [place]  HPI:  The patient is a 55 year old, mildly overweight, married Caucasian male with no children who I last saw 02/16/15. He has a history of CAD status post bypass grafting x3 by Dr. Ashley Marinerub Owen November 2008 with a LIMA to his LAD, a vein to a diagonal branch and marginal branch. He did well postoperatively and completed cardiac rehab. His other problems include hypertension, hyperlipidemia and noninsulin-requiring diabetes. He has had a stroke in the past followed by Dr. Delia HeadyPramod Sethi. He had a Myoview performed 03/04/12 which was nonischemic. He denies chest pain or shortness of breath.   Current Outpatient Prescriptions  Medication Sig Dispense Refill  . aspirin 325 MG EC tablet Take 325 mg by mouth daily.    Marland Kitchen. atorvastatin (LIPITOR) 40 MG tablet Take 40 mg by mouth daily.    . insulin detemir (LEVEMIR) 100 UNIT/ML injection Inject 50 Units into the skin daily.    . metFORMIN (GLUCOPHAGE) 500 MG tablet Take 500 mg by mouth 2 (two) times daily with a meal.    . metoprolol (LOPRESSOR) 100 MG tablet Take 100 mg by mouth 2 (two) times daily.    . valsartan (DIOVAN) 160 MG tablet Take 160 mg by mouth daily.     No current facility-administered medications for this visit.     No Known Allergies  Social History   Social History  . Marital status: Married    Spouse name: N/A  . Number of children: N/A  . Years of education: N/A   Occupational History  . Not on file.   Social History Main Topics  . Smoking status: Never Smoker  . Smokeless tobacco: Current User    Types: Chew  . Alcohol use 0.5 oz/week    1 Standard drinks or equivalent per week     Comment: occasional drinker  . Drug use: Unknown  . Sexual activity: Not on file   Other Topics Concern  . Not on file   Social History Narrative  . No narrative on  file     Review of Systems: General: negative for chills, fever, night sweats or weight changes.  Cardiovascular: negative for chest pain, dyspnea on exertion, edema, orthopnea, palpitations, paroxysmal nocturnal dyspnea or shortness of breath Dermatological: negative for rash Respiratory: negative for cough or wheezing Urologic: negative for hematuria Abdominal: negative for nausea, vomiting, diarrhea, bright red blood per rectum, melena, or hematemesis Neurologic: negative for visual changes, syncope, or dizziness All other systems reviewed and are otherwise negative except as noted above.    Blood pressure 108/66, pulse 60, height 5\' 10"  (1.778 m), weight 224 lb (101.6 kg).  General appearance: alert and no distress Neck: no adenopathy, no carotid bruit, no JVD, supple, symmetrical, trachea midline and thyroid not enlarged, symmetric, no tenderness/mass/nodules Lungs: clear to auscultation bilaterally Heart: regular rate and rhythm, S1, S2 normal, no murmur, click, rub or gallop Extremities: extremities normal, atraumatic, no cyanosis or edema  EKG sinus rhythm at 60 with right bundle branch block and left axis deviation. I personally reviewed this EKG  ASSESSMENT AND PLAN:   Coronary artery disease History of CAD status post coronary artery bypass grafting by Dr. Cornelius Moraswen  November 2008. He had a LIMA to his LAD, vein to vital branch and marginal branch. He did well postoperatively. His last Myoview performed 03/04/12 was nonischemic. He denies  chest pain or shortness of breath.  Essential hypertension History of hypertension blood pressure mesentery 108/66. He is on valsartan and metoprolol. Continue current meds at current dosing  Hyperlipidemia History of hyperlipidemia on statin therapy followed by his PCP      Runell GessJonathan J. Kyrstin Campillo MD [facility] LLCFACP,FACC,FAHA, [facility] 04/04/2016 9:43 AM

## 2016-04-04 NOTE — Assessment & Plan Note (Signed)
History of hypertension blood pressure mesentery 108/66. He is on valsartan and metoprolol. Continue current meds at current dosing

## 2016-04-04 NOTE — Assessment & Plan Note (Signed)
History of hyperlipidemia on statin therapy followed by his PCP 

## 2016-10-10 ENCOUNTER — Ambulatory Visit (INDEPENDENT_AMBULATORY_CARE_PROVIDER_SITE_OTHER): Payer: BC Managed Care – PPO | Admitting: Neurology

## 2016-10-10 ENCOUNTER — Encounter: Payer: Self-pay | Admitting: Neurology

## 2016-10-10 VITALS — BP 105/61 | HR 68 | Resp 20 | Ht 70.0 in | Wt 225.0 lb

## 2016-10-10 DIAGNOSIS — R251 Tremor, unspecified: Secondary | ICD-10-CM

## 2016-10-10 MED ORDER — GABAPENTIN 300 MG PO CAPS: 300.0000 mg | ORAL_CAPSULE | Freq: Three times a day (TID) | ORAL | 11 refills | Status: DC

## 2016-10-10 NOTE — Patient Instructions (Addendum)
Remember to drink plenty of fluid, eat healthy meals and do not skip any meals. Try to eat protein with a every meal and eat a healthy snack such as fruit or nuts in between meals. Try to keep a regular sleep-wake schedule and try to exercise daily, particularly in the form of walking, 20-30 minutes a day, if you can.   As far as your medications are concerned, I would like to suggest; Gabapentin 3x a day  I would like to see you back in 4 months, sooner if we need to. Please call us with any interim questions, concerns, problems, updates or refill requests.   Our phone number is [phone]. We also have an after hours call service for urgent matters and there is a physician on-call for urgent questions. For any emergencies you know to call 911 or go to the nearest emergency room  Gabapentin capsules or tablets What is this medicine? GABAPENTIN (GA ba pen tin) is used to control partial seizures in adults with epilepsy. It is also used to treat certain types of nerve pain. This medicine may be used for other purposes; ask your health care provider or pharmacist if you have questions. COMMON BRAND NAME(S): Active-PAC with Gabapentin, Gabarone, Neurontin What should I tell my health care provider before I take this medicine? They need to know if you have any of these conditions: -kidney disease -suicidal thoughts, plans, or attempt; a previous suicide attempt by you or a family member -an unusual or allergic reaction to gabapentin, other medicines, foods, dyes, or preservatives -pregnant or trying to get pregnant -breast-feeding How should I use this medicine? Take this medicine by mouth with a glass of water. Follow the directions on the prescription label. You can take it with or without food. If it upsets your stomach, take it with food.Take your medicine at regular intervals. Do not take it more often than directed. Do not stop taking except on your doctor's advice. If you are directed to  break the 600 or 800 mg tablets in half as part of your dose, the extra half tablet should be used for the next dose. If you have not used the extra half tablet within 28 days, it should be thrown away. A special MedGuide will be given to you by the pharmacist with each prescription and refill. Be sure to read this information carefully each time. Talk to your pediatrician regarding the use of this medicine in children. Special care may be needed. Overdosage: If you think you have taken too much of this medicine contact a poison control center or emergency room at once. NOTE: This medicine is only for you. Do not share this medicine with others. What if I miss a dose? If you miss a dose, take it as soon as you can. If it is almost time for your next dose, take only that dose. Do not take double or extra doses. What may interact with this medicine? Do not take this medicine with any of the following medications: -other gabapentin products This medicine may also interact with the following medications: -alcohol -antacids -antihistamines for allergy, cough and cold -certain medicines for anxiety or sleep -certain medicines for depression or psychotic disturbances -homatropine; hydrocodone -naproxen -narcotic medicines (opiates) for pain -phenothiazines like chlorpromazine, mesoridazine, prochlorperazine, thioridazine This list may not describe all possible interactions. Give your health care provider a list of all the medicines, herbs, non-prescription drugs, or dietary supplements you use. Also tell them if you smoke, drink alcohol, or use  illegal drugs. Some items may interact with your medicine. What should I watch for while using this medicine? Visit your doctor or health care professional for regular checks on your progress. You may want to keep a record at home of how you feel your condition is responding to treatment. You may want to share this information with your doctor or health care  professional at each visit. You should contact your doctor or health care professional if your seizures get worse or if you have any new types of seizures. Do not stop taking this medicine or any of your seizure medicines unless instructed by your doctor or health care professional. Stopping your medicine suddenly can increase your seizures or their severity. Wear a medical identification bracelet or chain if you are taking this medicine for seizures, and carry a card that lists all your medications. You may get drowsy, dizzy, or have blurred vision. Do not drive, use machinery, or do anything that needs mental alertness until you know how this medicine affects you. To reduce dizzy or fainting spells, do not sit or stand up quickly, especially if you are an older patient. Alcohol can increase drowsiness and dizziness. Avoid alcoholic drinks. Your mouth may get dry. Chewing sugarless gum or sucking hard candy, and drinking plenty of water will help. The use of this medicine may increase the chance of suicidal thoughts or actions. Pay special attention to how you are responding while on this medicine. Any worsening of mood, or thoughts of suicide or dying should be reported to your health care professional right away. Women who become pregnant while using this medicine may enroll in the [place] American Antiepileptic Drug Pregnancy Registry by calling 504-277-3757. This registry collects information about the safety of antiepileptic drug use during pregnancy. What side effects may I notice from receiving this medicine? Side effects that you should report to your doctor or health care professional as soon as possible: -allergic reactions like skin rash, itching or hives, swelling of the face, lips, or tongue -worsening of mood, thoughts or actions of suicide or dying Side effects that usually do not require medical attention (report to your doctor or health care professional if they continue or are  bothersome): -constipation -difficulty walking or controlling muscle movements -dizziness -nausea -slurred speech -tiredness -tremors -weight gain This list may not describe all possible side effects. Call your doctor for medical advice about side effects. You may report side effects to FDA at 1-800-FDA-1088. Where should I keep my medicine? Keep out of reach of children. This medicine may cause accidental overdose and death if it taken by other adults, children, or pets. Mix any unused medicine with a substance like cat litter or coffee grounds. Then throw the medicine away in a sealed container like a sealed bag or a coffee can with a lid. Do not use the medicine after the expiration date. Store at room temperature between 15 and 30 degrees C (59 and 86 degrees F). NOTE: This sheet is a summary. It may not cover all possible information. If you have questions about this medicine, talk to your doctor, pharmacist, or health care provider.  2017 Elsevier/Gold Standard (2013-09-30 15:26:50)

## 2016-10-10 NOTE — Progress Notes (Signed)
[facility]    Provider:  Dr Lucia Gaskins Referring Provider: Elias Else, MD Primary Care Physician:  Lolita Patella, MD  CC:  Tremor  HPI:  [name] is a 56 y.o. male here as a referral from Dr. Nicholos Frommer for tremor. Past medical history of hypertension, poorly controlled diabetes. Tremor started after the stroke. He saw Dr. Sandria Manly in 2008 for this tremor after stroke. Dr. Sandria Manly tried treating it with anti-epilepsy meds  but he doesn;t remember what it was. The medicine did help maybe. He also has had decreased sensation in the left hand and leg since the stroke, some weakness. He has a tremor with hand use, more resting but also with use and it is in the limb not just in the left hand, high amplitude and low frequency tremor. 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 Worse with eating, with holding a book. Tightening up his muscles may help.   Reviewed notes, labs and imaging from outside physicians, which showed:  Reviewed primary care notes. Patient has a nine-year history of a slow amplitude tremor in his left hand. This began following a right sided CVA approximately 9 years ago. 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 He also has stress at his new job. Anxiety. He is on metoprolol, valsartan, atorvastatin, aspirin 325 daily and medications for his diabetes. Neurologic exam shows a left sided pill-rolling tremor on exam.  MRI brain 2008 personally reviewed images: There is acute infarct in the right posterior lateral thalamus  Reviewed notes from Dr. Sandria Manly: He treated patient with Topamax for sensory changes after stroke but there was no mention of tremor at that time.  Review of Systems: Patient complains of symptoms per HPI as well as the following symptoms: Numbness, tremor, anxiety. Pertinent negatives per HPI. All others negative.   Social History   Social History    . Marital status: Married    Spouse name: N/A  . Number of children: N/A  . Years of education: N/A   Occupational History  . Not on file.   Social History Main Topics  . Smoking status: Never Smoker  . Smokeless tobacco: Current User    Types: Chew  . Alcohol use 0.5 oz/week    1 Standard drinks or equivalent per week     Comment: occasional drinker  . Drug use: Unknown  . Sexual activity: Not on file   Other Topics Concern  . Not on file   Social History Narrative  . No narrative on file    Family History  Problem Relation Age of Onset  . Hypertension Father   . Stroke Father   . Heart disease Father   . Heart disease Paternal Grandfather   . Heart disease Paternal Grandmother   . Tremor Neg Hx     Past Medical History:  Diagnosis Date  . CAD (coronary artery disease)    Cath 06/23/07:mid LAD 40-50%,optional diagonal 70-80% narrowing  . CVA (cerebral vascular accident) (HCC)    Dr. Pearlean Brownie  . DM (diabetes mellitus) (HCC)   . Hyperlipemia   . Hypertension   . LVH (left ventricular hypertrophy) 08/24/07   mild, EF >55%  . 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 Alfonse Flavors to LAD,vein ato a diagonal branch & marginal branch    Past Surgical History:  Procedure Laterality Date  . CARDIAC CATHETERIZATION  06/23/07   LAD 40-50%,OPTIONAL DIAG 70-80%  . CHOLECYSTECTOMY  05/31/09   Dr. Abbey Chattersosenbower  . CORONARY ARTERY BYPASS GRAFT  06/24/07   LIMA to distal LAD,saphenous vein graft to first diagonal,saphenous vein graft to lateral subbranch of the circumflex marginal branch    Current Outpatient Prescriptions  Medication Sig Dispense Refill  . aspirin 325 MG EC tablet Take 325 mg by mouth daily.    Marland Kitchen. atorvastatin (LIPITOR) 40 MG tablet Take 40 mg by mouth daily.    . insulin detemir (LEVEMIR) 100 UNIT/ML injection Inject 50 Units into the skin daily.    . metFORMIN (GLUCOPHAGE)  500 MG tablet Take 500 mg by mouth 2 (two) times daily with a meal.    . metoprolol (LOPRESSOR) 100 MG tablet Take 100 mg by mouth 2 (two) times daily.    . valsartan (DIOVAN) 160 MG tablet Take 160 mg by mouth daily.    Marland Kitchen. gabapentin (NEURONTIN) 300 MG capsule Take 1 capsule (300 mg total) by mouth 3 (three) times daily. 90 capsule 11   No current facility-administered medications for this visit.     Allergies as of 10/10/2016  . (No Known Allergies)    Vitals: BP 105/61   Pulse 68   Resp 20   Ht 5\' 10"  (1.778 m)   Wt 102.1 kg (225 lb)   BMI 32.28 kg/m  Last Weight:  Wt Readings from Last 1 Encounters:  10/10/16 102.1 kg (225 lb)   Last Height:   Ht Readings from Last 1 Encounters:  10/10/16 5\' 10"  (1.778 m)    Physical exam: Exam: Gen: NAD, conversant, well nourised, obese, well groomed                     CV: RRR, no MRG. No Carotid Bruits. No peripheral edema, warm, nontender Eyes: Conjunctivae clear without exudates or hemorrhage  Neuro: Detailed Neurologic Exam  Speech:    Speech is normal; fluent and spontaneous with normal comprehension.  Cognition:    The patient is oriented to person, place, and time;     recent and remote memory intact;     language fluent;     normal attention, concentration,     fund of knowledge Cranial Nerves:    The pupils are equal, round, and reactive to light. The fundi are normal and spontaneous venous pulsations are present. Visual fields are full to finger confrontation. Extraocular movements are intact. Trigeminal sensation is intact and the muscles of mastication are normal. The face is symmetric. The palate elevates in the midline. Hearing intact. Voice is normal. Shoulder shrug is normal. The tongue has normal motion without fasciculations.   Coordination:  end-point dysmetria on left FTN  Gait:    Heel-toe and tandem gait are normal.   Motor Observation:    Left arm positional tremor worse with intention with end-point  dysmetria Tone:    Normal muscle tone.    Posture:    Posture is normal. normal erect    Strength:    Strength is V/V in the upper and lower limbs.      Sensation: intact to LT     Reflex Exam:  DTR's:     absent ajs. Brisk reflexes left biceps Toes:    The toes are downgoing bilaterally.   Clonus:    Clonus is absent.    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  Tremor is positional, worse with intention with end-point dysmetria, almost rubral appearing. No resting tremor.  Discussed with patient that tremors have been associated with vascular lesions disrupting the interconnecting fiber tracts between the basal ganglia, thalamus, and motor cortex; Movement disorders resulting from basal ganglia and thalamic infarction typically present unilaterally in the contralateral side of the ischemic lesion, and there is a delay in the onset of these conditions after a stroke occurs. Discussed with patient, had Dr. Terrace Arabia observe the patient and botox therapy is likely not to help. Will try Gabapentin. May consider second opinion by a movement disorder expert such as Lurena Joiner Tat. Will check 2 labs today and follow.   CC:  Lolita Patella, MD   Naomie Dean, MD  [facility] [address] [place], [place] [address]  Phone [phone] Fax [phone]

## 2016-10-12 ENCOUNTER — Encounter: Payer: Self-pay | Admitting: Neurology

## 2016-10-12 DIAGNOSIS — R251 Tremor, unspecified: Secondary | ICD-10-CM | POA: Insufficient documentation

## 2016-10-14 LAB — THYROID PANEL WITH TSH
Free Thyroxine Index: 2 (ref 1.2–4.9)
T3 Uptake Ratio: 28 % (ref 24–39)
T4, Total: 7.1 ug/dL (ref 4.5–12.0)
TSH: 2.06 u[IU]/mL (ref 0.450–4.500)

## 2016-10-14 LAB — HEAVY METALS, BLOOD
Arsenic: 5 ug/L (ref 2–23)
Lead, Blood: 5 ug/dL (ref 0–19)
MERCURY: 2.7 ug/L (ref 0.0–14.9)

## 2016-10-15 ENCOUNTER — Telehealth: Payer: Self-pay

## 2016-10-15 NOTE — Telephone Encounter (Signed)
I called pt and advised him that per Dr. Lucia GaskinsAhern, his labs were normal. Pt verbalized understanding of results. Pt had no questions at this time but was encouraged to call back if questions arise.

## 2016-10-15 NOTE — Telephone Encounter (Signed)
-----   Message from Anson FretAntonia B Ahern, MD sent at 10/13/2016  6:41 PM EST ----- Labs normal thanks

## 2017-01-21 ENCOUNTER — Encounter (INDEPENDENT_AMBULATORY_CARE_PROVIDER_SITE_OTHER): Payer: Self-pay

## 2017-01-21 ENCOUNTER — Encounter: Payer: Self-pay | Admitting: Neurology

## 2017-01-21 ENCOUNTER — Ambulatory Visit (INDEPENDENT_AMBULATORY_CARE_PROVIDER_SITE_OTHER): Payer: BC Managed Care – PPO | Admitting: Neurology

## 2017-01-21 VITALS — BP 120/70 | HR 76 | Ht 70.0 in | Wt 223.2 lb

## 2017-01-21 DIAGNOSIS — R251 Tremor, unspecified: Secondary | ICD-10-CM | POA: Diagnosis not present

## 2017-01-21 MED ORDER — PRIMIDONE 50 MG PO TABS: 50.0000 mg | ORAL_TABLET | Freq: Every day | ORAL | 11 refills | Status: DC

## 2017-01-21 NOTE — Progress Notes (Signed)
[facility]    Provider:  Dr Lucia Gaskins Referring Provider: Elias Else, MD Primary Care Physician:  Elias Else, MD  CC:  Tremor  Interval history 01/21/2017: This is a 56 year old patient with a tremor of the left arm appearance right posteriolateral thalamic stroke. At last appointment we discussed the tremors have been associated with vascular lesions disrupting the basal ganglia, thalamus and motor cortex. He was started on Neurontin. He is back today for follow-up. He tried the gabapentin and it may have helped the tremor a little bit but had trouble focusing and stopped it on his own without letting us know. It impaired his ability to make judgements and focus.  Discussed other medications. He has caffeine every morning 4-5 cups, advised stopping that and stress also makes it worse. No snoring at night, no significant fatigue.  HPI:  [name] is a 56 y.o. male here as a referral from Dr. Nicholos Gainer for tremor. Past medical history of hypertension, poorly controlled diabetes. Tremor started after the stroke. He saw Dr. Sandria Manly in 2008 for this tremor after stroke. Dr. Sandria Manly tried treating it with anti-epilepsy meds  but he doesn;t remember what it was. The medicine did help maybe. He also has had decreased sensation in the left hand and leg since the stroke, some weakness. He has a tremor with hand use, more resting but also with use and it is in the limb not just in the left hand, high amplitude and low frequency tremor. 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 Worse with eating, with holding a book. Tightening up his muscles may help.   Reviewed notes, labs and imaging from outside physicians, which showed:  Reviewed primary care notes. Patient has a nine-year history of a slow amplitude tremor in his left hand. This began following a right sided CVA approximately 9 years ago. Over time the tremors worsen and this would become problematic because he works with  machines that require very fine touch. He also has stress at his new job. Anxiety. He is on metoprolol, valsartan, atorvastatin, aspirin 325 daily and medications for his diabetes. Neurologic exam shows a left sided pill-rolling tremor on exam.  MRI brain 2008 personally reviewed images: There is acute infarct in the right posterior lateral thalamus  Reviewed notes from Dr. Sandria Manly: He treated patient with Topamax for sensory changes after stroke but there was no mention of tremor at that time.  Review of Systems: Patient complains of symptoms per HPI as well as the following symptoms: Numbness, tremor, anxiety. Pertinent negatives per HPI. All others negative.   Social History   Social History  . Marital status: Married    Spouse name: N/A  . Number of children: N/A  . Years of education: N/A   Occupational History  . Not on file.   Social History Main Topics  . Smoking status: Never Smoker  . Smokeless tobacco: Current User    Types: Chew  . Alcohol use 0.5 oz/week    1 Standard drinks or equivalent per week     Comment: occasional drinker  . Drug use: Unknown  . Sexual activity: Not on file   Other Topics Concern  . Not on file   Social History Narrative  . No narrative on file    Family History  Problem Relation Age of Onset  . Hypertension Father   . Stroke Father   . Heart disease Father   . Heart disease Paternal Grandfather   . Heart  disease Paternal Grandmother   . Tremor Neg Hx     Past Medical History:  Diagnosis Date  . CAD (coronary artery disease)    Cath 06/23/07:mid LAD 40-50%,optional diagonal 70-80% narrowing  . CVA (cerebral vascular accident) (HCC)    Dr. Pearlean BrownieSethi  . DM (diabetes mellitus) (HCC)   . Hyperlipemia   . Hypertension   . LVH (left ventricular hypertrophy) 08/24/07   mild, EF >55%  . 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 Alfonse Flavorswen-LIMA to LAD,vein  ato a diagonal branch & marginal branch    Past Surgical History:  Procedure Laterality Date  . CARDIAC CATHETERIZATION  06/23/07   LAD 40-50%,OPTIONAL DIAG 70-80%  . CHOLECYSTECTOMY  05/31/09   Dr. Abbey Chattersosenbower  . CORONARY ARTERY BYPASS GRAFT  06/24/07   LIMA to distal LAD,saphenous vein graft to first diagonal,saphenous vein graft to lateral subbranch of the circumflex marginal branch    Current Outpatient Prescriptions  Medication Sig Dispense Refill  . aspirin 325 MG EC tablet Take 325 mg by mouth daily.    Marland Kitchen. atorvastatin (LIPITOR) 40 MG tablet Take 40 mg by mouth daily.    Marland Kitchen. gabapentin (NEURONTIN) 300 MG capsule Take 1 capsule (300 mg total) by mouth 3 (three) times daily. 90 capsule 11  . insulin detemir (LEVEMIR) 100 UNIT/ML injection Inject 50 Units into the skin daily.    . metFORMIN (GLUCOPHAGE) 500 MG tablet Take 500 mg by mouth 2 (two) times daily with a meal.    . metoprolol (LOPRESSOR) 100 MG tablet Take 100 mg by mouth 2 (two) times daily.    . valsartan (DIOVAN) 160 MG tablet Take 160 mg by mouth daily.     No current facility-administered medications for this visit.     Allergies as of 01/21/2017  . (No Known Allergies)    Vitals: There were no vitals taken for this visit. Last Weight:  Wt Readings from Last 1 Encounters:  10/10/16 225 lb (102.1 kg)   Last Height:   Ht Readings from Last 1 Encounters:  10/10/16 5\' 10"  (1.778 m)   Physical exam: Exam: Gen: NAD, conversant, well nourised, obese, well groomed                     CV: RRR, no MRG. No Carotid Bruits. No peripheral edema, warm, nontender Eyes: Conjunctivae clear without exudates or hemorrhage  Neuro: Detailed Neurologic Exam  Speech:    Speech is normal; fluent and spontaneous with normal comprehension.  Cognition:    The patient is oriented to person, place, and time;     recent and remote memory intact;     language fluent;     normal attention, concentration,     fund of  knowledge Cranial Nerves:    The pupils are equal, round, and reactive to light. The fundi are normal and spontaneous venous pulsations are present. Visual fields are full to finger confrontation. Extraocular movements are intact. Trigeminal sensation is intact and the muscles of mastication are normal. The face is symmetric. The palate elevates in the midline. Hearing intact. Voice is normal. Shoulder shrug is normal. The tongue has normal motion without fasciculations.   Coordination:    Point dysmetria on left finger to nose otherwise normal finger to nose and heel to shin.  Gait:    Heel-toe and tandem gait are normal.   Motor Observation:    Left  arm positional tremor Tone:    Normal muscle tone.    Posture:    Posture is normal. normal erect    Strength:    Strength is V/V in the upper and lower limbs.      Sensation: intact to LT     Reflex Exam:  DTR's: Absent ankle jerks. Left biceps is brisk otherwise deep tendon reflexes in the upper and lower extremities are normal bilaterally.   Toes:    The toes are downgoing bilaterally.   Clonus:    Clonus is absent.       Assessment/Plan:  This is a 56 year old male with tremor of the left arm since right posterio lateral thalamic stroke. Tremor is positional, worse with intention and with end-point dysmetria, almost rubral appearing. No resting tremor. He did not tolerate Neurontin. We'll try primidone although advised this can also cause sedation and fatigue. If patient doesn't tolerate it or doesn't work may try dopamine replacement or dopamine agonists.   Discussed with patient that tremors have been associated with vascular lesions disrupting the interconnecting fiber tracts between the basal ganglia, thalamus, and motor cortex; Movement disorders resulting from basal ganglia and thalamic infarction typically present unilaterally in the contralateral side of the ischemic lesion, and there is a delay in the onset of these  conditions after a stroke occurs. Discussed with patient, had Dr. Terrace Arabia observe the patient and botox therapy is likely not to help. Will try Gabapentin. May consider second opinion by a movement disorder expert such as Lurena Joiner Tat. Will check 2 labs today and follow.   CC:  Lolita Patella, MD  Naomie Dean, MD  [facility] [place] [facility] [address] [place], [place] [address]  Phone [phone] Fax [phone]  A total of 15 minutes was spent face-to-face with this patient. Over half this time was spent on counseling patient on the tremor diagnosis and different diagnostic and therapeutic options available.

## 2017-01-21 NOTE — Patient Instructions (Addendum)
Remember to drink plenty of fluid, eat healthy meals and do not skip any meals. Try to eat protein with a every meal and eat a healthy snack such as fruit or nuts in between meals. Try to keep a regular sleep-wake schedule and try to exercise daily, particularly in the form of walking, 20-30 minutes a day, if you can.   As far as your medications are concerned, I would like to suggest: Try Primidone(Mysoline) as prescribed. This can cause sedation and dizziness, start taking it early in the evening and do not drive after taking it until you know how it affects you.   I would like to see you back in 6 months, sooner if we need to. Please call us with any interim questions, concerns, problems, updates or refill requests.   Our phone number is [phone]. We also have an after hours call service for urgent matters and there is a physician on-call for urgent questions. For any emergencies you know to call 911 or go to the nearest emergency room  Primidone tablets What is this medicine? PRIMIDONE (PRI mi done) is a barbiturate. This medicine is used to control seizures in certain types of epilepsy. It is not for use in absence (petit mal) seizures. This medicine may be used for other purposes; ask your health care provider or pharmacist if you have questions. COMMON BRAND NAME(S): Mysoline What should I tell my health care provider before I take this medicine? They need to know if you have any of these conditions: -kidney disease -liver disease -porphyria -suicidal thoughts, plans, or attempt; a previous suicide attempt by you or a family member -an unusual or allergic reaction to primidone, phenobarbital, other barbiturates or seizure medications, other medicines, foods, dyes, or preservatives -pregnant or trying to get pregnant -breast-feeding How should I use this medicine? Take this medicine by mouth with a glass of water. Follow the directions on the prescription label. Take your doses at  regular intervals. Do not take your medicine more often than directed. Do not stop taking except on the advice of your doctor or health care professional. A special MedGuide will be given to you by the pharmacist with each prescription and refill. Be sure to read this information carefully each time. Contact your pediatrician or health care professional regarding the use of this medication in children. Special care may be needed. While this drug may be prescribed for children for selected conditions, precautions do apply. Overdosage: If you think you have taken too much of this medicine contact a poison control center or emergency room at once. NOTE: This medicine is only for you. Do not share this medicine with others. What if I miss a dose? If you miss a dose, take it as soon as you can. If it is almost time for your next dose, take only that dose. Do not take double or extra doses. What may interact with this medicine? Do not take this medicine with any of the following medications: -voriconazole This medicine may also interact with the following medications: -cancer-treating medications -cyclosporine -disopyramide -doxycycline -male hormones, including contraceptive or birth control pills -medicines for mental depression, anxiety or other mood problems -medicines for treating HIV infection or AIDS -modafinil -prescription pain medications -quinidine -warfarin This list may not describe all possible interactions. Give your health care provider a list of all the medicines, herbs, non-prescription drugs, or dietary supplements you use. Also tell them if you smoke, drink alcohol, or use illegal drugs. Some items may interact with  your medicine. What should I watch for while using this medicine? Visit your doctor or health care professional for regular checks on your progress. It may be 2 to 3 weeks before you see the full effects of this medicine. Do not suddenly stop taking this medicine,  you may increase the risk of seizures. Your doctor or health care professional may want to gradually reduce the dose. Wear a medical identification bracelet or chain to say you have epilepsy, and carry a card that lists all your medications. You may get drowsy or dizzy. Do not drive, use machinery, or do anything that needs mental alertness until you know how this medicine affects you. Do not stand or sit up quickly, especially if you are an older patient. This reduces the risk of dizzy or fainting spells. Alcohol may interfere with the effect of this medicine. Avoid alcoholic drinks. Birth control pills may not work properly while you are taking this medicine. Talk to your doctor about using an extra method of birth control. The use of this medicine may increase the chance of suicidal thoughts or actions. Pay special attention to how you are responding while on this medicine. Any worsening of mood, or thoughts of suicide or dying should be reported to your health care professional right away. Women who become pregnant while using this medicine may enroll in the [place] Antiepileptic Drug Pregnancy Registry by calling [phone]. This registry collects information about the safety of antiepileptic drug use during pregnancy. What side effects may I notice from receiving this medicine? Side effects that you should report to your doctor or health care professional as soon as possible: -allergic reactions like skin rash, itching or hives, swelling of the face, lips, or tongue -blurred, double vision, or uncontrollable rolling or movements of the eyes -redness, blistering, peeling or loosening of the skin, including inside the mouth -shortness of breath or difficulty breathing -unusual excitement or restlessness, more likely in children and the elderly -unusually weak or tired -worsening of mood, thoughts or actions of suicide or dying Side effects that usually do not require medical attention  (report to your doctor or health care professional if they continue or are bothersome): -clumsiness, unsteadiness, or a hang-over effect -decreased sexual ability -dizziness, drowsiness -loss of appetite -nausea or vomiting This list may not describe all possible side effects. Call your doctor for medical advice about side effects. You may report side effects to FDA at 1-800-FDA-1088. Where should I keep my medicine? Keep out of the reach of children. This medicine may cause accidental overdose and death if it taken by other adults, children, or pets. Mix any unused medicine with a substance like cat litter or coffee grounds. Then throw the medicine away in a sealed container like a sealed bag or a coffee can with a lid. Do not use the medicine after the expiration date. Store at room temperature between 15 and 30 degrees C (59 and 86 degrees F). NOTE: This sheet is a summary. It may not cover all possible information. If you have questions about this medicine, talk to your doctor, pharmacist, or health care provider.  2018 Elsevier/Gold Standard (2013-09-30 15:40:08)

## 2017-02-22 ENCOUNTER — Encounter: Payer: Self-pay | Admitting: Neurology

## 2017-04-10 ENCOUNTER — Ambulatory Visit (INDEPENDENT_AMBULATORY_CARE_PROVIDER_SITE_OTHER): Payer: BC Managed Care – PPO | Admitting: Cardiovascular Disease

## 2017-04-10 ENCOUNTER — Encounter: Payer: Self-pay | Admitting: Cardiovascular Disease

## 2017-04-10 VITALS — BP 126/70 | HR 77 | Ht 70.0 in | Wt 221.6 lb

## 2017-04-10 DIAGNOSIS — I2510 Atherosclerotic heart disease of native coronary artery without angina pectoris: Secondary | ICD-10-CM

## 2017-04-10 DIAGNOSIS — I10 Essential (primary) hypertension: Secondary | ICD-10-CM | POA: Diagnosis not present

## 2017-04-10 DIAGNOSIS — E7800 Pure hypercholesterolemia, unspecified: Secondary | ICD-10-CM | POA: Diagnosis not present

## 2017-04-10 NOTE — Patient Instructions (Signed)
Medication Instructions: Your physician recommends that you continue on your current medications as directed. Please refer to the Current Medication list given to you today.  Labwork: I will request your recent blood work from Dr. Benjaman Pott office.   Follow-Up: Your physician wants you to follow-up in: 1 year with Dr. Allyson Sabal. You will receive a reminder letter in the mail two months in advance. If you don't receive a letter, please call our office to schedule the follow-up appointment.  If you need a refill on your cardiac medications before your next appointment, please call your pharmacy.

## 2017-04-10 NOTE — Assessment & Plan Note (Signed)
History of hyperlipidemia on statin therapy followed by his PCP 

## 2017-04-10 NOTE — Assessment & Plan Note (Signed)
History of essential hypertension with blood pressure measurement today of 126/70. He is on metoprolol. Continue current meds at current dosing

## 2017-04-10 NOTE — Assessment & Plan Note (Signed)
History of coronary artery disease status post bypass grafting 3 by Dr. Ashley Mariner 11/08 with a LIMA to the LAD, vein to diagonal branch and marginal branch. He did have a mildly performed 03/04/12 which was nonischemic. He denies chest pain or shortness of breath.

## 2017-04-10 NOTE — Progress Notes (Signed)
04/10/2017 Suszanne Conners Luebke   [birth year]  [id number]  Primary Physician Elias Else, MD Primary Cardiologist: Runell Gess MD Milag[facility]oll, [place], [place]  HPI:  [name] is a 56 y.o. male mildly overweight, married Caucasian male with no children who I last saw 04/04/16. He has a history of CAD status post bypass grafting x3 by Dr. Ashley Mariner November 2008 with a LIMA to his LAD, a vein to a diagonal branch and marginal branch. He did well postoperatively and completed cardiac rehab. His other problems include hypertension, hyperlipidemia and noninsulin-requiring diabetes. He has had a stroke in the past followed by Dr. Delia Heady. He had a Myoview performed 03/04/12 which was nonischemic. He denies chest pain or shortness of breath   Current Meds  Medication Sig  . atorvastatin (LIPITOR) 40 MG tablet Take 40 mg by mouth daily.  Marland Kitchen LEVEMIR FLEXTOUCH 100 UNIT/ML Pen Inject 40 Units as directed daily.  . metFORMIN (GLUCOPHAGE) 500 MG tablet Take 500 mg by mouth 2 (two) times daily with a meal.  . metoprolol (LOPRESSOR) 100 MG tablet Take 100 mg by mouth 2 (two) times daily.  Marland Kitchen telmisartan (MICARDIS) 40 MG tablet Take 1 tablet by mouth daily.  . TRULICITY 1.5 MG/0.5ML SOPN      No Known Allergies  Social History   Social History  . Marital status: Married    Spouse name: N/A  . Number of children: 0  . Years of education: N/A   Occupational History  . Machinist    Social History Main Topics  . Smoking status: Never Smoker  . Smokeless tobacco: Current User    Types: Chew  . Alcohol use 0.5 oz/week    1 Standard drinks or equivalent per week     Comment: occasional drinker  . Drug use: Unknown  . Sexual activity: Not on file   Other Topics Concern  . Not on file   Social History Narrative   Lives at home w/ his wife   Right-handed   Caffeine: 4-5 cups of coffee per day     Review of Systems: General: negative for chills, fever, night sweats or weight  changes.  Cardiovascular: negative for chest pain, dyspnea on exertion, edema, orthopnea, palpitations, paroxysmal nocturnal dyspnea or shortness of breath Dermatological: negative for rash Respiratory: negative for cough or wheezing Urologic: negative for hematuria Abdominal: negative for nausea, vomiting, diarrhea, bright red blood per rectum, melena, or hematemesis Neurologic: negative for visual changes, syncope, or dizziness All other systems reviewed and are otherwise negative except as noted above.    Blood pressure 126/70, pulse 77, height 5\' 10"  (1.778 m), weight 221 lb 9.6 oz (100.5 kg).  General appearance: alert and no distress Neck: no adenopathy, no carotid bruit, no JVD, supple, symmetrical, trachea midline and thyroid not enlarged, symmetric, no tenderness/mass/nodules Lungs: clear to auscultation bilaterally Heart: regular rate and rhythm, S1, S2 normal, no murmur, click, rub or gallop Extremities: extremities normal, atraumatic, no cyanosis or edema  EKG sinus rhythm at 77 with right bundle-branch block and left axis deviation. I personally reviewed this EKG.  ASSESSMENT AND PLAN:   Coronary artery disease History of coronary artery disease status post bypass grafting 3 by Dr. Ashley Mariner 11/08 with a LIMA to the LAD, vein to diagonal branch and marginal branch. He did have a mildly performed 03/04/12 which was nonischemic. He denies chest pain or shortness of breath.  Essential hypertension History of essential hypertension with blood pressure measurement  today of 126/70. He is on metoprolol. Continue current meds at current dosing  Hyperlipidemia History of hyperlipidemia on statin therapy followed by his PCP      Runell Gess MD [facility], [facility] 04/10/2017 11:22 AM

## 2017-06-25 ENCOUNTER — Other Ambulatory Visit: Payer: Self-pay | Admitting: Family Medicine

## 2017-06-25 DIAGNOSIS — R52 Pain, unspecified: Secondary | ICD-10-CM

## 2017-06-25 DIAGNOSIS — M7989 Other specified soft tissue disorders: Secondary | ICD-10-CM

## 2017-06-26 ENCOUNTER — Ambulatory Visit
Admission: RE | Admit: 2017-06-26 | Discharge: 2017-06-26 | Disposition: A | Discharge status: Home / Self Care | Payer: BC Managed Care – PPO | Source: Ambulatory Visit | Attending: Family Medicine | Admitting: Family Medicine

## 2017-06-26 DIAGNOSIS — M7989 Other specified soft tissue disorders: Secondary | ICD-10-CM

## 2017-06-26 DIAGNOSIS — R52 Pain, unspecified: Secondary | ICD-10-CM

## 2017-07-23 ENCOUNTER — Ambulatory Visit (INDEPENDENT_AMBULATORY_CARE_PROVIDER_SITE_OTHER): Payer: BC Managed Care – PPO | Admitting: Neurology

## 2017-07-23 DIAGNOSIS — R251 Tremor, unspecified: Secondary | ICD-10-CM

## 2017-07-23 NOTE — Progress Notes (Signed)
cancelled

## 2017-12-13 IMAGING — US US EXTREM LOW VENOUS*L*
1 series · 13 of 24 positions shown · non-contrast
Comparison: None.

CLINICAL DATA: Left leg pain and swelling



[Series 1: us extrem low venous*left* · 0.08mm/px · 13 of 28 slices shown]
[im 1/28]
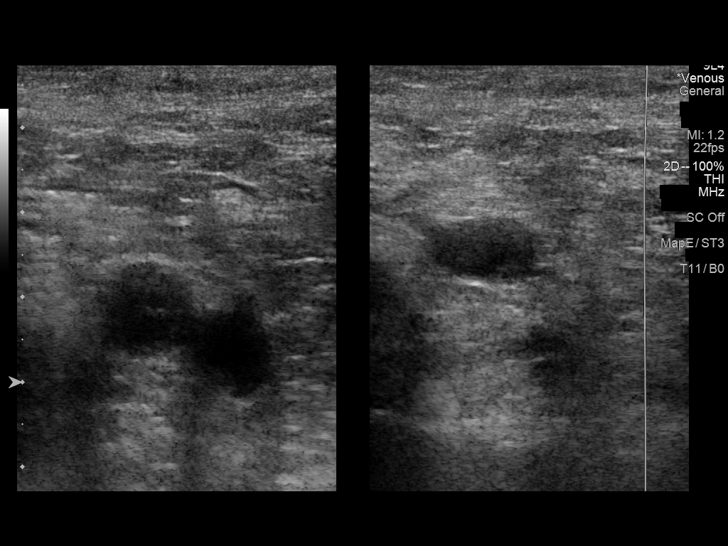
[im 3/28]
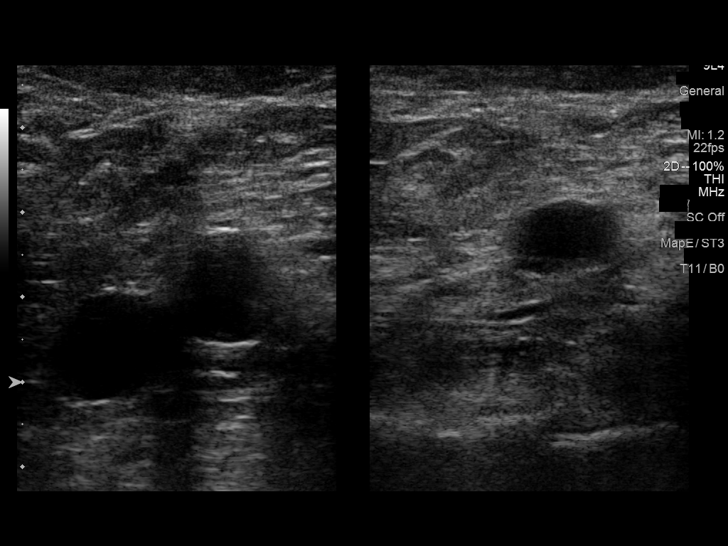
[im 5/28]
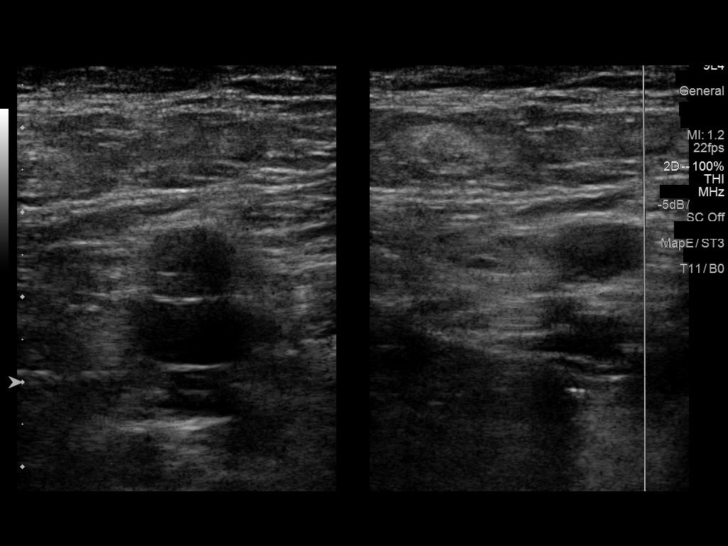
[im 8/28]
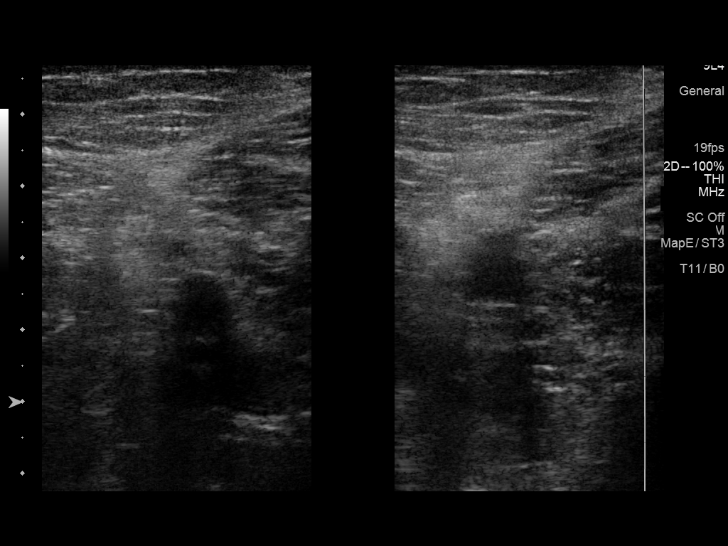
[im 10/28]
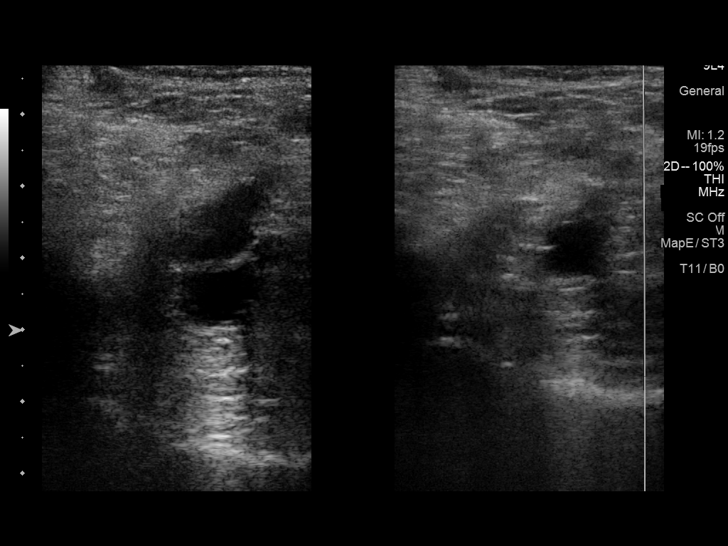
[im 12/28]
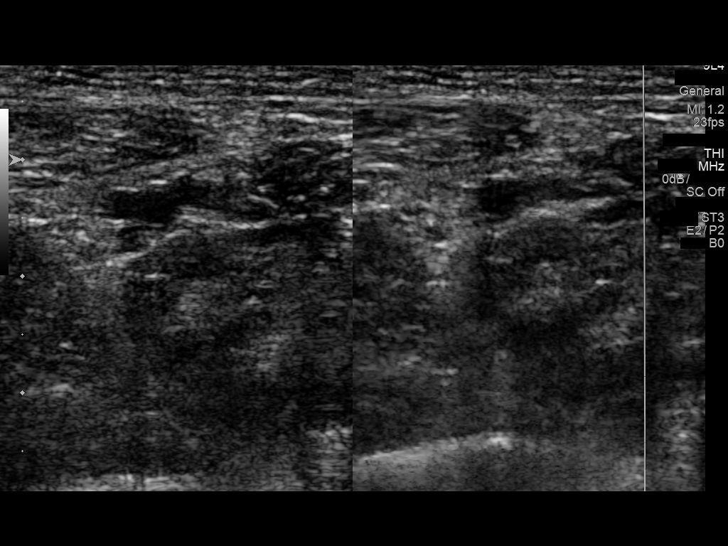
[im 15/28]
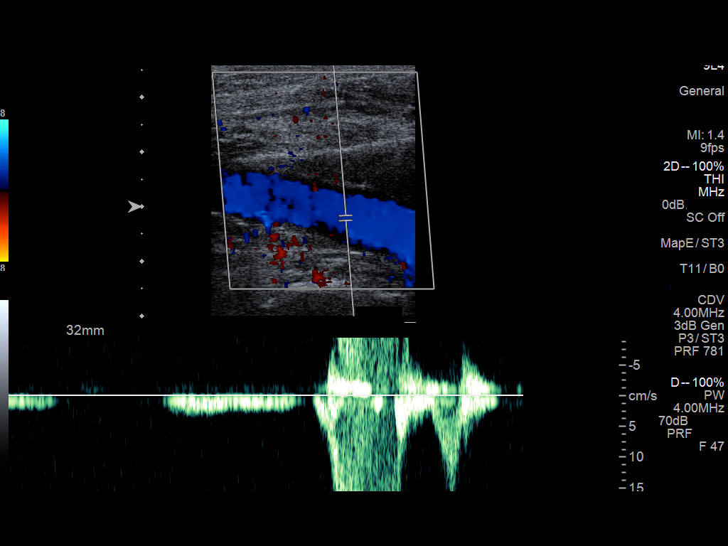
[im 16/28]
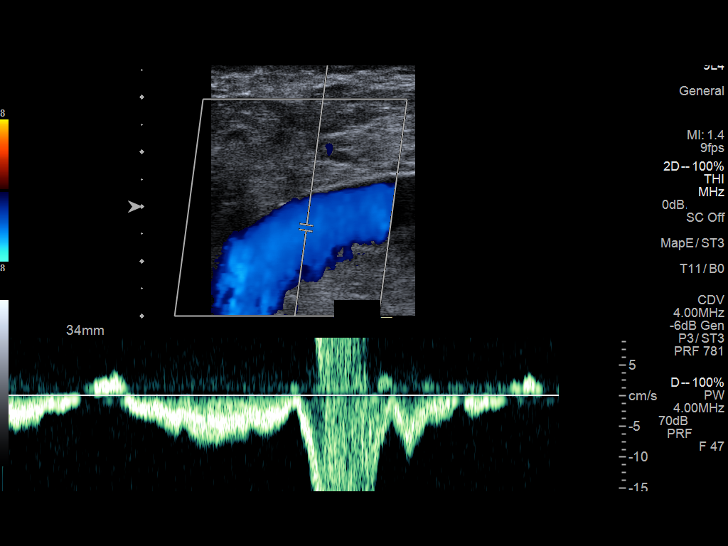
[im 18/28]
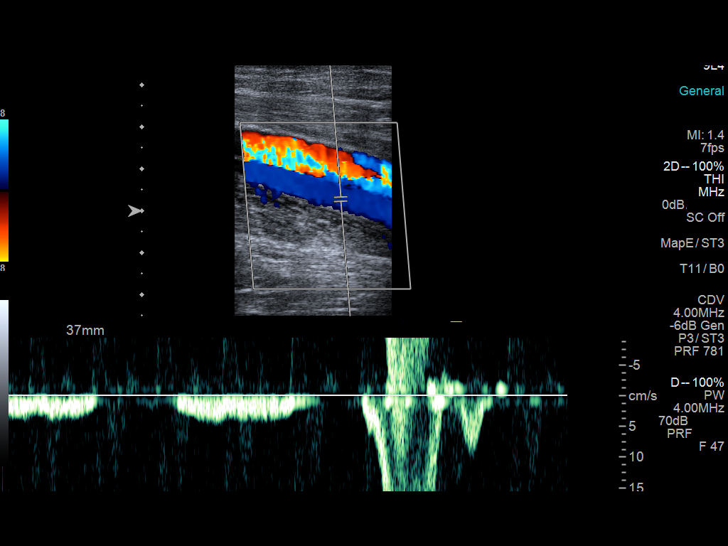
[im 20/28]
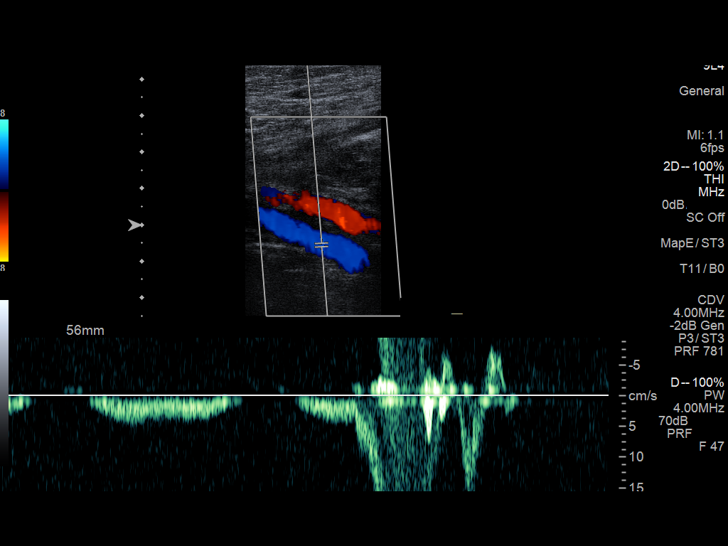
[im 23/28]
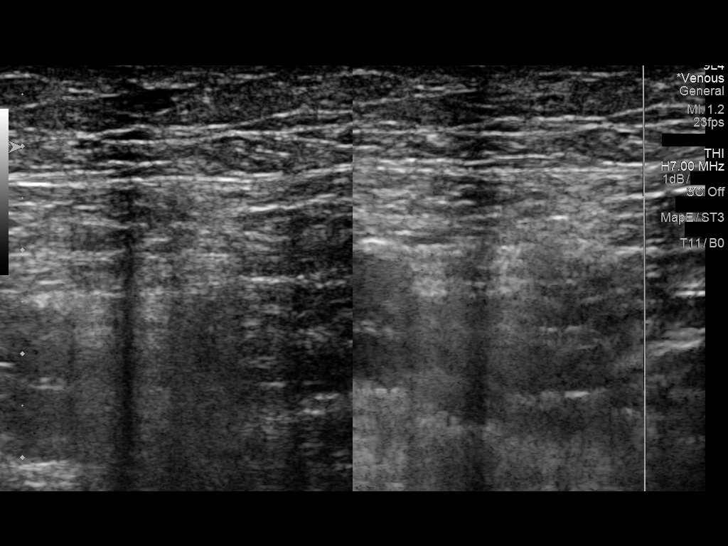
[im 25/28]
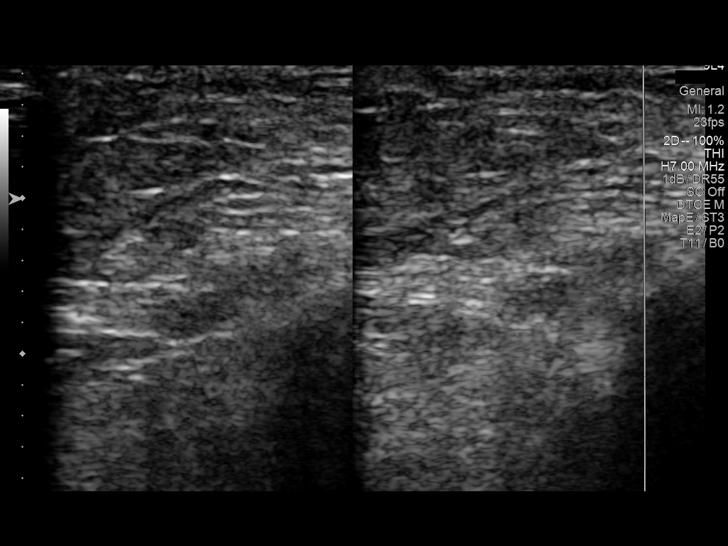
[im 28/28]
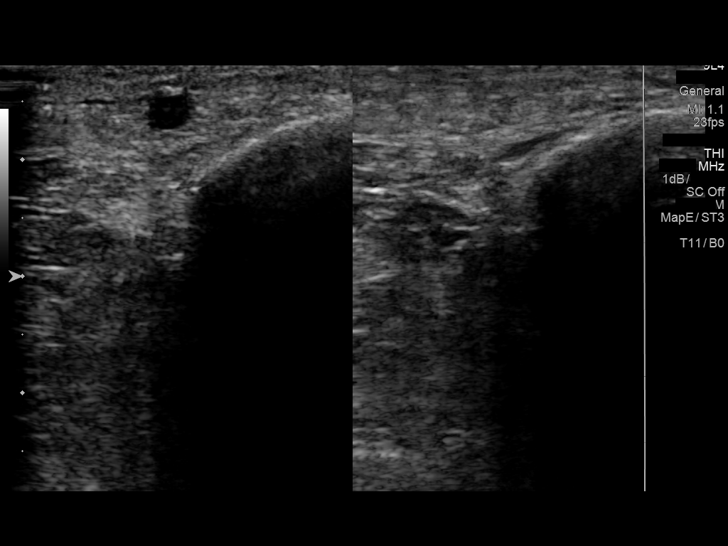

[13 of 24 positions shown; findings below may reference images not displayed]

FINDINGS: Contralateral Common Femoral Vein: Respiratory phasicity is normal
and symmetric with the symptomatic side. No evidence of thrombus.
Normal compressibility.

Common Femoral Vein: No evidence of thrombus. Normal
compressibility, respiratory phasicity and response to augmentation.

Saphenofemoral Junction: No evidence of thrombus. Normal
compressibility and flow on color Doppler imaging.

Profunda Femoral Vein: No evidence of thrombus. Normal
compressibility and flow on color Doppler imaging.

Femoral Vein: No evidence of thrombus. Normal compressibility,
respiratory phasicity and response to augmentation.

Popliteal Vein: No evidence of thrombus. Normal compressibility,
respiratory phasicity and response to augmentation.

Calf Veins: Peroneal vein is not well visualized. The of the calf
veins are within normal limits.

Superficial Great Saphenous Vein: No evidence of thrombus. Normal
compressibility.

Venous Reflux:  None.

Other Findings:  None.
IMPRESSION: No evidence of deep venous thrombosis.

## 2018-03-11 DIAGNOSIS — Z0271 Encounter for disability determination: Secondary | ICD-10-CM
